# Patient Record
Sex: Female | Born: 1960 | Race: White | Hispanic: No | Marital: Married | Smoking: Never smoker
Health system: Southern US, Community
[De-identification: ages and names within clinical notes are randomized; demographics above are authoritative.]

## PROBLEM LIST (undated history)

## (undated) DIAGNOSIS — I10 Essential (primary) hypertension: Secondary | ICD-10-CM

## (undated) DIAGNOSIS — E119 Type 2 diabetes mellitus without complications: Secondary | ICD-10-CM

## (undated) DIAGNOSIS — R51 Headache: Secondary | ICD-10-CM

## (undated) DIAGNOSIS — F329 Major depressive disorder, single episode, unspecified: Secondary | ICD-10-CM

## (undated) DIAGNOSIS — E785 Hyperlipidemia, unspecified: Secondary | ICD-10-CM

## (undated) DIAGNOSIS — K589 Irritable bowel syndrome without diarrhea: Secondary | ICD-10-CM

## (undated) DIAGNOSIS — F32A Depression, unspecified: Secondary | ICD-10-CM

## (undated) DIAGNOSIS — M542 Cervicalgia: Secondary | ICD-10-CM

## (undated) DIAGNOSIS — G8929 Other chronic pain: Secondary | ICD-10-CM

## (undated) HISTORY — DX: Type 2 diabetes mellitus without complications: E11.9

## (undated) HISTORY — PX: OTHER SURGICAL HISTORY: SHX169

---

## 1998-11-15 ENCOUNTER — Other Ambulatory Visit: Admission: RE | Admit: 1998-11-15 | Discharge: 1998-11-15 | Payer: Self-pay | Admitting: Obstetrics and Gynecology

## 2000-01-08 ENCOUNTER — Other Ambulatory Visit: Admission: RE | Admit: 2000-01-08 | Discharge: 2000-01-08 | Payer: Self-pay | Admitting: Obstetrics and Gynecology

## 2010-02-19 ENCOUNTER — Emergency Department (HOSPITAL_COMMUNITY): Admission: EM | Admit: 2010-02-19 | Discharge: 2010-02-19 | Payer: Self-pay | Admitting: Emergency Medicine

## 2010-07-24 LAB — URINALYSIS, ROUTINE W REFLEX MICROSCOPIC
Bilirubin Urine: NEGATIVE
Glucose, UA: NEGATIVE mg/dL
Hgb urine dipstick: NEGATIVE
Ketones, ur: NEGATIVE mg/dL
Nitrite: NEGATIVE
Protein, ur: NEGATIVE mg/dL
Specific Gravity, Urine: 1.009 (ref 1.005–1.030)
Urobilinogen, UA: 0.2 mg/dL (ref 0.0–1.0)
pH: 7 (ref 5.0–8.0)

## 2010-07-24 LAB — DIFFERENTIAL
Basophils Absolute: 0 10*3/uL (ref 0.0–0.1)
Basophils Relative: 0 % (ref 0–1)
Eosinophils Absolute: 0.1 10*3/uL (ref 0.0–0.7)
Eosinophils Relative: 1 % (ref 0–5)
Lymphocytes Relative: 24 % (ref 12–46)
Lymphs Abs: 1.8 10*3/uL (ref 0.7–4.0)
Monocytes Absolute: 0.5 10*3/uL (ref 0.1–1.0)
Monocytes Relative: 7 % (ref 3–12)
Neutro Abs: 5 10*3/uL (ref 1.7–7.7)
Neutrophils Relative %: 68 % (ref 43–77)

## 2010-07-24 LAB — POCT PREGNANCY, URINE: Preg Test, Ur: NEGATIVE

## 2010-07-24 LAB — URINE MICROSCOPIC-ADD ON

## 2010-07-24 LAB — BASIC METABOLIC PANEL
BUN: 10 mg/dL (ref 6–23)
CO2: 24 mEq/L (ref 19–32)
Calcium: 9.4 mg/dL (ref 8.4–10.5)
Chloride: 103 mEq/L (ref 96–112)
Creatinine, Ser: 0.67 mg/dL (ref 0.4–1.2)
GFR calc Af Amer: >60 mL/min (ref >60)
GFR calc non Af Amer: >60 mL/min (ref >60)
Glucose, Bld: 86 mg/dL (ref 70–99)
Potassium: 3.6 mEq/L (ref 3.5–5.1)
Sodium: 137 mEq/L (ref 135–145)

## 2010-07-24 LAB — CBC
HCT: 42.7 % (ref 36.0–46.0)
Hemoglobin: 14.8 g/dL (ref 12.0–15.0)
MCH: 30.9 pg (ref 26.0–34.0)
MCHC: 34.7 g/dL (ref 30.0–36.0)
MCV: 89.1 fL (ref 78.0–100.0)
Platelets: 307 10*3/uL (ref 150–400)
RBC: 4.8 MIL/uL (ref 3.87–5.11)
RDW: 13.6 % (ref 11.5–15.5)
WBC: 7.5 10*3/uL (ref 4.0–10.5)

## 2011-11-23 IMAGING — CT CT HEAD W/O CM
1 series · 16 of 30 positions shown, 20 images · non-contrast
Comparison: None.

CLINICAL DATA: Dizziness, visual changes, slurred speech

CT HEAD WITHOUT CONTRAST
TECHNIQUE: Contiguous axial images were obtained from the base of
the skull through the vertex without contrast.

[Series 2: headseq 4.8 h45s · axial · 0.43mm/px · z∈[-141,-13]mm · 16 of 30 slices shown, 20 images]
[im 2/30  brain]
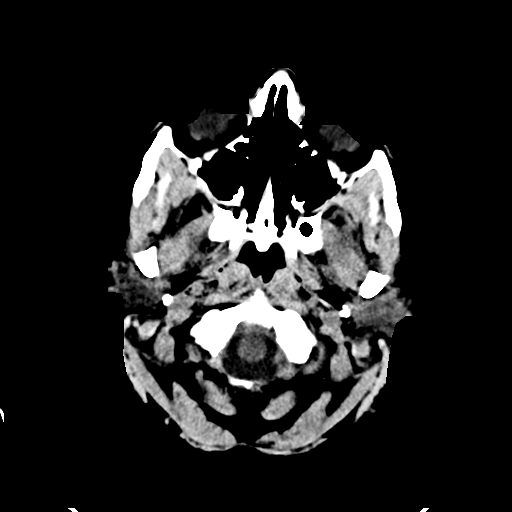
[im 2/30  bone]
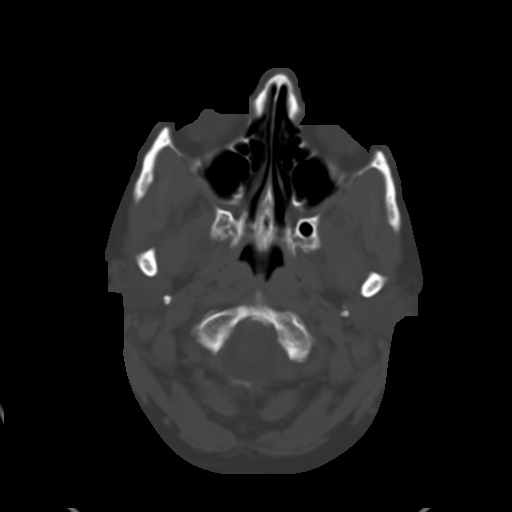
[im 4/30  brain]
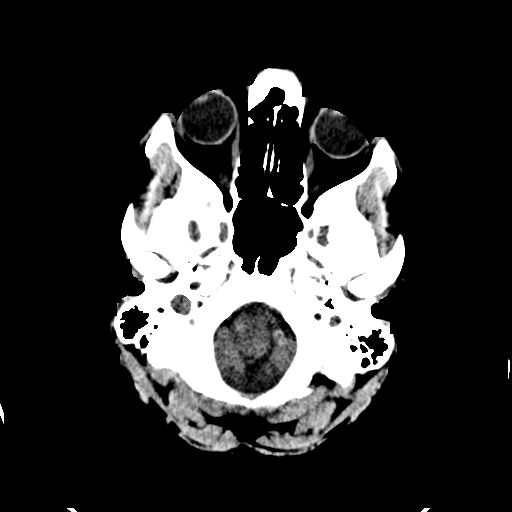
[im 6/30  brain]
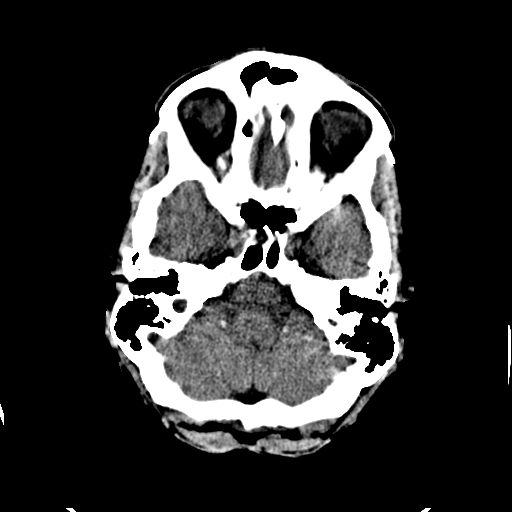
[im 8/30  brain]
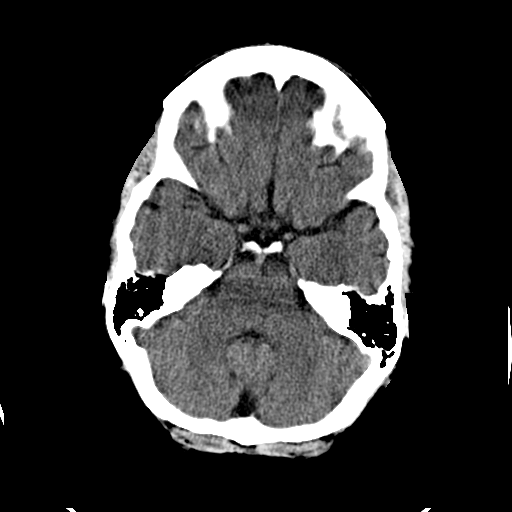
[im 9/30  brain]
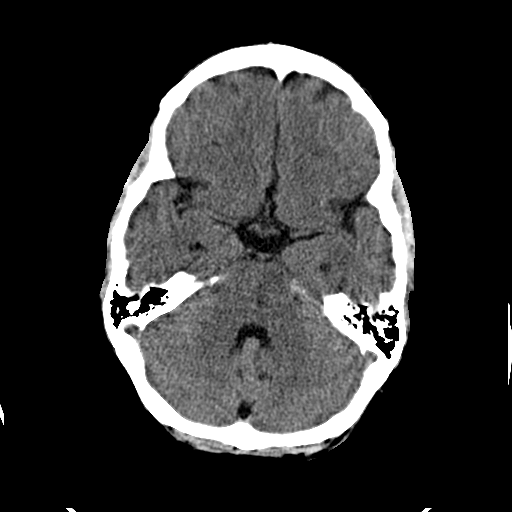
[im 9/30  bone]
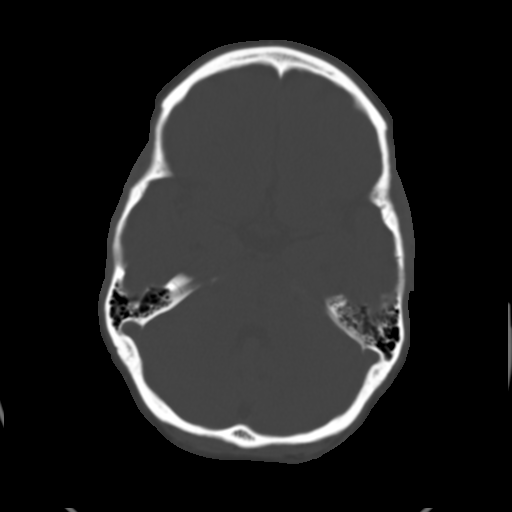
[im 11/30  brain]
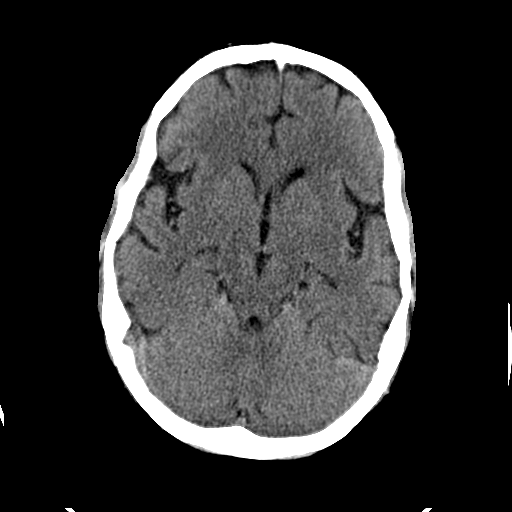
[im 13/30  brain]
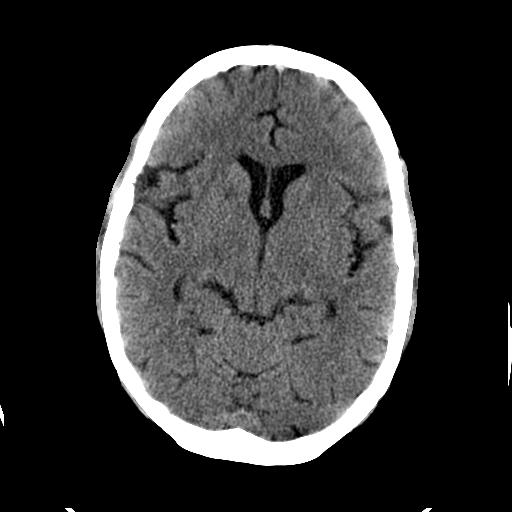
[im 15/30  brain]
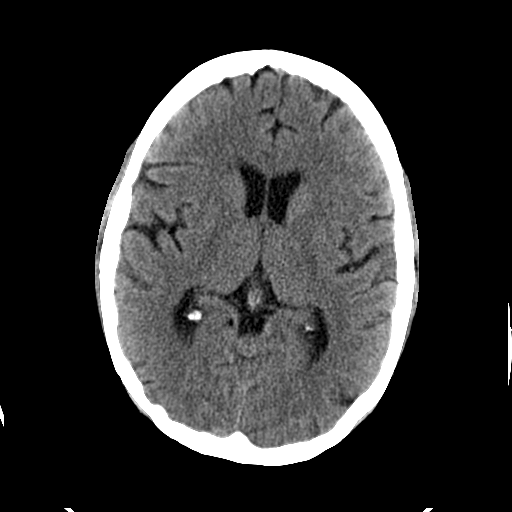
[im 16/30  brain]
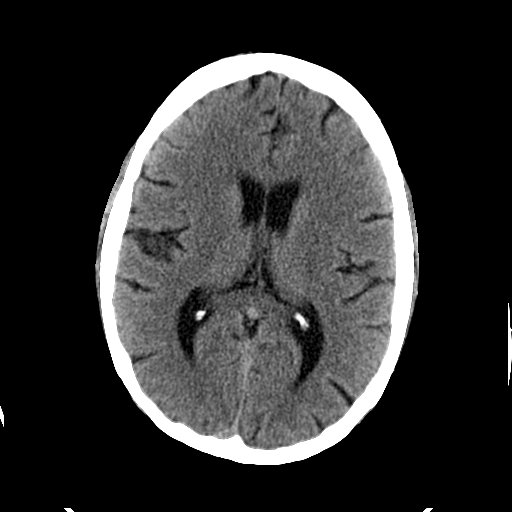
[im 16/30  bone]
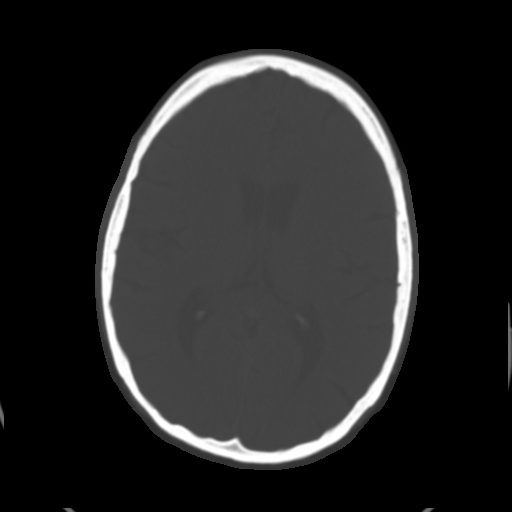
[im 18/30  brain]
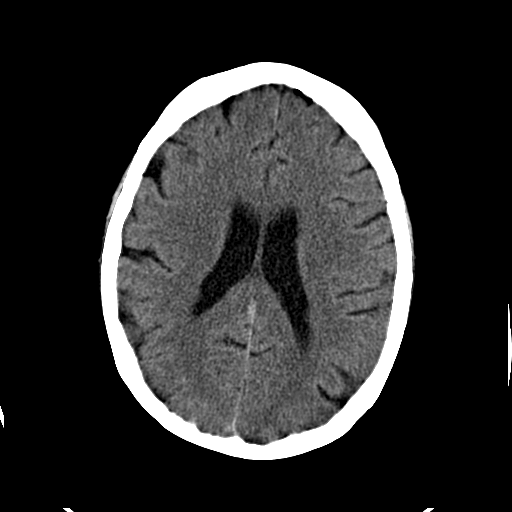
[im 20/30  brain]
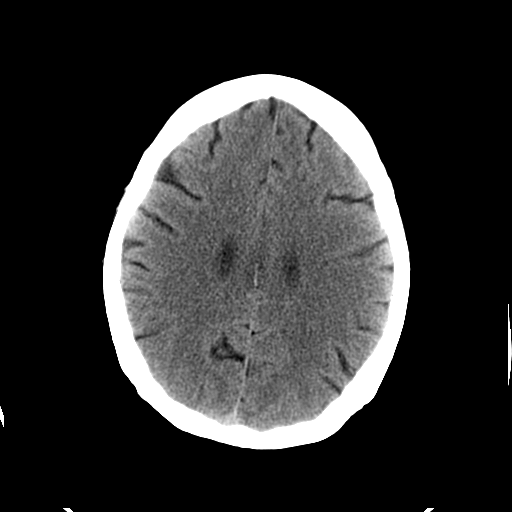
[im 22/30  brain]
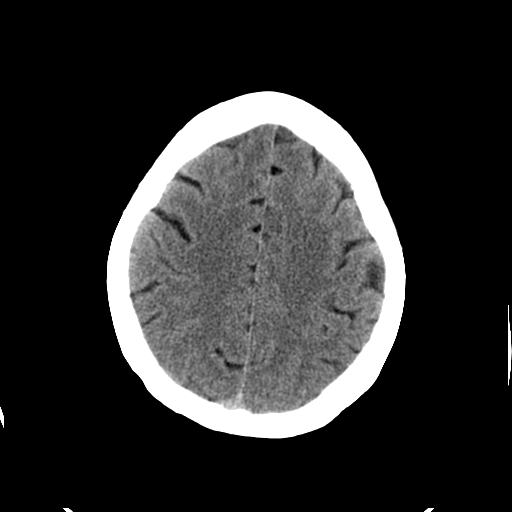
[im 23/30  brain]
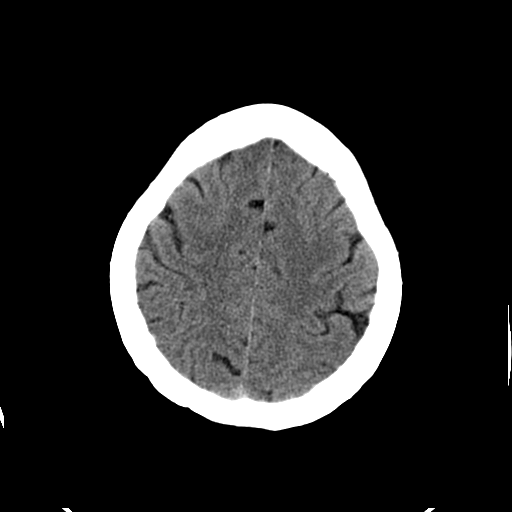
[im 23/30  bone]
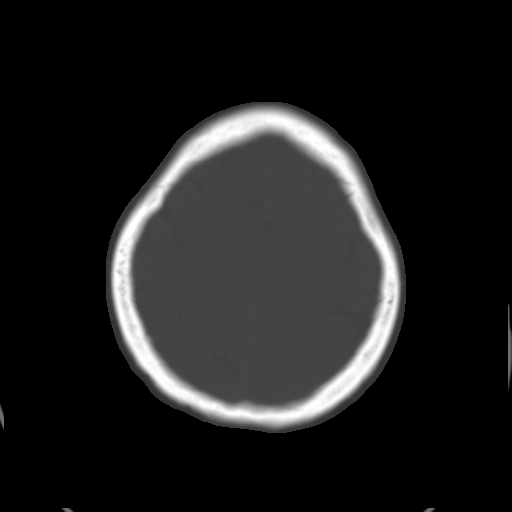
[im 25/30  brain]
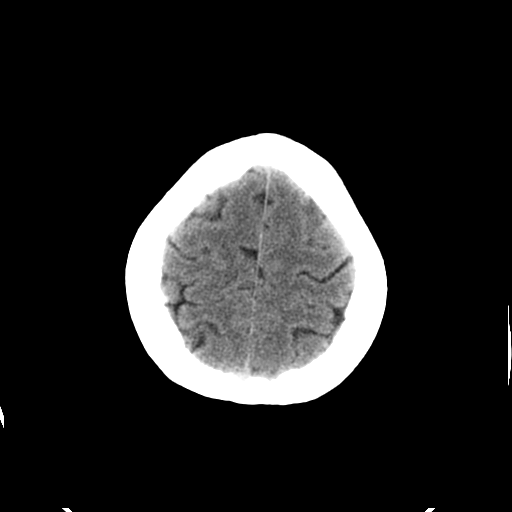
[im 27/30  brain]
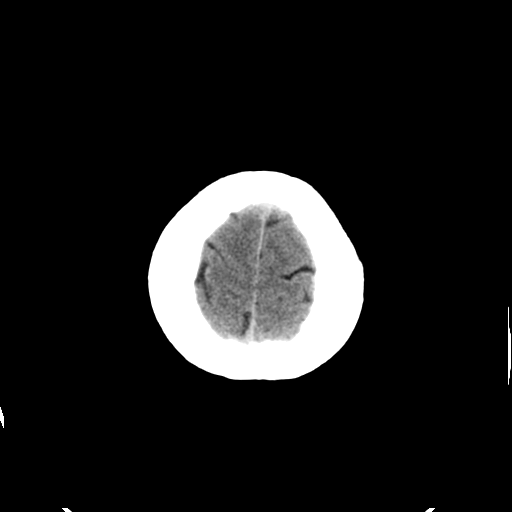
[im 29/30  brain]
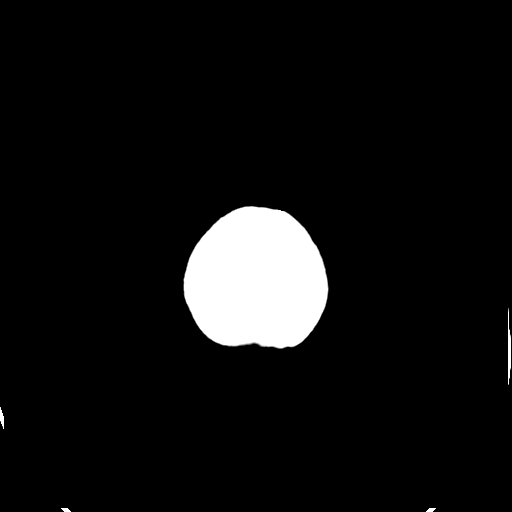

[16 of 30 positions shown; findings below may reference images not displayed]

FINDINGS: There is no evidence of acute intracranial hemorrhage,
brain edema, mass lesion, acute infarction,   mass effect, or
midline shift. Acute infarct may be inapparent on noncontrast CT.
No other intra-axial abnormalities are seen, and the ventricles and
sulci are within normal limits in size and symmetry.   No abnormal
extra-axial fluid collections or masses are identified.  No
significant calvarial abnormality.
IMPRESSION: Negative for bleed or other acute intracranial process.

## 2013-11-08 ENCOUNTER — Other Ambulatory Visit: Payer: Self-pay | Admitting: Gastroenterology

## 2013-11-28 ENCOUNTER — Encounter (HOSPITAL_COMMUNITY): Payer: Self-pay | Admitting: Pharmacy Technician

## 2013-11-30 ENCOUNTER — Encounter (HOSPITAL_COMMUNITY): Payer: Self-pay | Admitting: Pharmacy Technician

## 2013-12-08 ENCOUNTER — Encounter (HOSPITAL_COMMUNITY): Payer: Self-pay | Admitting: *Deleted

## 2013-12-19 ENCOUNTER — Ambulatory Visit (HOSPITAL_COMMUNITY): Payer: 59 | Admitting: Anesthesiology

## 2013-12-19 ENCOUNTER — Encounter (HOSPITAL_COMMUNITY): Payer: 59 | Admitting: Anesthesiology

## 2013-12-19 ENCOUNTER — Ambulatory Visit (HOSPITAL_COMMUNITY)
Admission: RE | Admit: 2013-12-19 | Discharge: 2013-12-19 | Disposition: A | Discharge status: Home / Self Care | Payer: 59 | Source: Ambulatory Visit | Attending: Gastroenterology | Admitting: Gastroenterology

## 2013-12-19 ENCOUNTER — Encounter (HOSPITAL_COMMUNITY): Payer: Self-pay | Admitting: *Deleted

## 2013-12-19 ENCOUNTER — Encounter (HOSPITAL_COMMUNITY)
Admission: RE | Disposition: A | Discharge status: Home / Self Care | Payer: Self-pay | Source: Ambulatory Visit | Attending: Gastroenterology

## 2013-12-19 DIAGNOSIS — I10 Essential (primary) hypertension: Secondary | ICD-10-CM | POA: Diagnosis not present

## 2013-12-19 DIAGNOSIS — R197 Diarrhea, unspecified: Secondary | ICD-10-CM | POA: Insufficient documentation

## 2013-12-19 DIAGNOSIS — Z79899 Other long term (current) drug therapy: Secondary | ICD-10-CM | POA: Insufficient documentation

## 2013-12-19 DIAGNOSIS — M542 Cervicalgia: Secondary | ICD-10-CM | POA: Diagnosis not present

## 2013-12-19 DIAGNOSIS — Z1211 Encounter for screening for malignant neoplasm of colon: Secondary | ICD-10-CM | POA: Insufficient documentation

## 2013-12-19 DIAGNOSIS — G8929 Other chronic pain: Secondary | ICD-10-CM | POA: Insufficient documentation

## 2013-12-19 HISTORY — DX: Headache: R51

## 2013-12-19 HISTORY — PX: COLONOSCOPY WITH PROPOFOL: SHX5780

## 2013-12-19 HISTORY — DX: Essential (primary) hypertension: I10

## 2013-12-19 SURGERY — COLONOSCOPY WITH PROPOFOL
Anesthesia: Monitor Anesthesia Care

## 2013-12-19 MED ORDER — LIDOCAINE HCL (CARDIAC) 20 MG/ML IV SOLN
INTRAVENOUS | Status: DC | PRN
  Administered 2013-12-19: 100 mg via INTRAVENOUS

## 2013-12-19 MED ORDER — ONDANSETRON HCL 4 MG/2ML IJ SOLN
INTRAMUSCULAR | Status: AC
  Filled 2013-12-19: qty 2

## 2013-12-19 MED ORDER — MIDAZOLAM HCL 2 MG/2ML IJ SOLN
INTRAMUSCULAR | Status: AC
  Filled 2013-12-19: qty 2

## 2013-12-19 MED ORDER — PROPOFOL 10 MG/ML IV BOLUS
INTRAVENOUS | Status: AC
  Filled 2013-12-19: qty 20

## 2013-12-19 MED ORDER — SODIUM CHLORIDE 0.9 % IV SOLN: INTRAVENOUS | Status: DC

## 2013-12-19 MED ORDER — PROPOFOL INFUSION 10 MG/ML OPTIME
INTRAVENOUS | Status: DC | PRN
  Administered 2013-12-19: 160 ug/kg/min via INTRAVENOUS

## 2013-12-19 MED ORDER — ONDANSETRON HCL 4 MG/2ML IJ SOLN
INTRAMUSCULAR | Status: DC | PRN
Start: 2013-12-19 — End: 2013-12-19
  Administered 2013-12-19: 4 mg via INTRAVENOUS

## 2013-12-19 MED ORDER — KETAMINE HCL 10 MG/ML IJ SOLN
INTRAMUSCULAR | Status: DC | PRN
  Administered 2013-12-19: 20 mg via INTRAVENOUS

## 2013-12-19 MED ORDER — LIDOCAINE HCL (CARDIAC) 20 MG/ML IV SOLN
INTRAVENOUS | Status: AC
  Filled 2013-12-19: qty 5

## 2013-12-19 MED ORDER — MIDAZOLAM HCL 5 MG/5ML IJ SOLN
INTRAMUSCULAR | Status: DC | PRN
  Administered 2013-12-19: 2 mg via INTRAVENOUS

## 2013-12-19 MED ORDER — LACTATED RINGERS IV SOLN
INTRAVENOUS | Status: DC
  Administered 2013-12-19: 1000 mL via INTRAVENOUS

## 2013-12-19 SURGICAL SUPPLY — 21 items

## 2013-12-19 NOTE — H&P (Signed)
  Procedure: Baseline screening colonoscopy. Chronic watery diarrhea rule out microscopic colitis  History: The patient is a 53 year old female born Nov 13, 1960. She is scheduled to undergo her first screening colonoscopy with polypectomy to prevent colon cancer.  For approximately 2 years, the patient has had intense, postprandial bowel urgency followed by the passage of watery bowel movements. She has not identified any particular food causes her symptoms. She reports no nocturnal diarrhea. There is no family history of colon cancer. She denies anorexia, fever, or weight loss.  Medication allergies: None  Past medical history: Hypertension. Seasonal allergies. Chronic watery diarrhea. Chronic neck pain.  Exam: The patient is alert and lying comfortably on the endoscopy stretcher. Abdomen is soft and nontender to palpation. Lungs are clear to auscultation. Cardiac exam reveals a regular rhythm.  Plan: Proceed with baseline screening colonoscopy and perform random colon biopsies to look for microscopic colitis

## 2013-12-19 NOTE — Transfer of Care (Signed)
Immediate Anesthesia Transfer of Care Note  Patient: Ellen Acosta  Procedure(s) Performed: Procedure(s) (LRB): COLONOSCOPY WITH PROPOFOL (N/A)  Patient Location: PACU  Anesthesia Type: MAC  Level of Consciousness: sedated, patient cooperative and responds to stimulation  Airway & Oxygen Therapy: Patient Spontanous Breathing and Patient connected to face mask oxgen  Post-op Assessment: Report given to PACU RN and Post -op Vital signs reviewed and stable  Post vital signs: Reviewed and stable  Complications: No apparent anesthesia complications

## 2013-12-19 NOTE — Op Note (Signed)
Procedure: Screening colonoscopy. Rule out microscopic colitis.  Endoscopist: Danise EdgeMartin Nuvia Hileman  Premedication: Propofol administered by anesthesia  Procedure: Screening colonoscopy with random colon biopsies to rule out microscopic colitis. The patient was placed in the left lateral decubitus position. Anal inspection and digital rectal exam were normal. The Pentax pediatric colonoscope was introduced into the rectum and advanced to the cecum. A normal-appearing ileocecal valve and appendiceal orifice were identified. Colonic preparation for the exam today was good.  Rectum. Normal. Retroflexed view of the distal rectum normal  Sigmoid colon and descending colon. Normal  Splenic flexure. Normal  Transverse colon. Normal  Hepatic flexure. Normal  Ascending colon. Normal  Cecum and ileocecal valve. Normal  Biopsies: Three biopsies were taken from the right colon and two biopsies were taken from the left colon to look for microscopic colitis  Assessment:  #1. Normal screening colonoscopy  #2. Random colon biopsies to rule out microscopic colitis pending  Recommendation: Schedule repeat screening colonoscopy in 10 years

## 2013-12-19 NOTE — Anesthesia Preprocedure Evaluation (Signed)
Anesthesia Evaluation  Patient identified by MRN, date of birth, ID band Patient awake    Reviewed: Allergy & Precautions, H&P , NPO status , Patient's Chart, lab work & pertinent test results  Airway Mallampati: II TM Distance: >3 FB Neck ROM: Full    Dental no notable dental hx.    Pulmonary neg pulmonary ROS,  breath sounds clear to auscultation  Pulmonary exam normal       Cardiovascular hypertension, Pt. on medications Rhythm:Regular Rate:Normal     Neuro/Psych negative neurological ROS  negative psych ROS   GI/Hepatic negative GI ROS, Neg liver ROS,   Endo/Other  negative endocrine ROS  Renal/GU negative Renal ROS  negative genitourinary   Musculoskeletal negative musculoskeletal ROS (+)   Abdominal   Peds negative pediatric ROS (+)  Hematology negative hematology ROS (+)   Anesthesia Other Findings   Reproductive/Obstetrics negative OB ROS                         Anesthesia Physical Anesthesia Plan  ASA: II  Anesthesia Plan: MAC   Post-op Pain Management:    Induction:   Airway Management Planned: Simple Face Mask  Additional Equipment:   Intra-op Plan:   Post-operative Plan:   Informed Consent: I have reviewed the patients History and Physical, chart, labs and discussed the procedure including the risks, benefits and alternatives for the proposed anesthesia with the patient or authorized representative who has indicated his/her understanding and acceptance.   Dental advisory given  Plan Discussed with: CRNA  Anesthesia Plan Comments:         Anesthesia Quick Evaluation  

## 2013-12-19 NOTE — Anesthesia Postprocedure Evaluation (Signed)
  Anesthesia Post-op Note  Patient: Ellen Acosta  Procedure(s) Performed: Procedure(s) (LRB): COLONOSCOPY WITH PROPOFOL (N/A)  Patient Location: PACU  Anesthesia Type: MAC  Level of Consciousness: awake and alert   Airway and Oxygen Therapy: Patient Spontanous Breathing  Post-op Pain: mild  Post-op Assessment: Post-op Vital signs reviewed, Patient's Cardiovascular Status Stable, Respiratory Function Stable, Patent Airway and No signs of Nausea or vomiting  Last Vitals:  Filed Vitals:   12/19/13 1038  BP: 149/94  Pulse: 86  Temp:   Resp: 11    Post-op Vital Signs: stable   Complications: No apparent anesthesia complications

## 2013-12-19 NOTE — Discharge Instructions (Addendum)

## 2013-12-20 ENCOUNTER — Encounter (HOSPITAL_COMMUNITY): Payer: Self-pay | Admitting: Gastroenterology

## 2015-02-05 ENCOUNTER — Ambulatory Visit: Payer: Self-pay | Admitting: Physician Assistant

## 2015-02-06 ENCOUNTER — Encounter (HOSPITAL_COMMUNITY): Payer: Self-pay

## 2015-02-06 ENCOUNTER — Encounter (HOSPITAL_COMMUNITY)
Admission: RE | Admit: 2015-02-06 | Discharge: 2015-02-06 | Disposition: A | Discharge status: Home / Self Care | Payer: 59 | Source: Ambulatory Visit | Attending: Orthopedic Surgery | Admitting: Orthopedic Surgery

## 2015-02-06 DIAGNOSIS — I10 Essential (primary) hypertension: Secondary | ICD-10-CM | POA: Diagnosis not present

## 2015-02-06 DIAGNOSIS — Z79899 Other long term (current) drug therapy: Secondary | ICD-10-CM | POA: Diagnosis not present

## 2015-02-06 DIAGNOSIS — M4722 Other spondylosis with radiculopathy, cervical region: Secondary | ICD-10-CM | POA: Diagnosis not present

## 2015-02-06 HISTORY — DX: Depression, unspecified: F32.A

## 2015-02-06 HISTORY — DX: Major depressive disorder, single episode, unspecified: F32.9

## 2015-02-06 HISTORY — DX: Irritable bowel syndrome, unspecified: K58.9

## 2015-02-06 HISTORY — DX: Other chronic pain: G89.29

## 2015-02-06 HISTORY — DX: Hyperlipidemia, unspecified: E78.5

## 2015-02-06 HISTORY — DX: Cervicalgia: M54.2

## 2015-02-06 LAB — BASIC METABOLIC PANEL
Anion gap: 12 (ref 5–15)
BUN: 9 mg/dL (ref 6–20)
CALCIUM: 9.5 mg/dL (ref 8.9–10.3)
CO2: 23 mmol/L (ref 22–32)
Chloride: 102 mmol/L (ref 101–111)
Creatinine, Ser: 0.54 mg/dL (ref 0.44–1.00)
GFR calc Af Amer: >60 mL/min (ref >60)
Glucose, Bld: 72 mg/dL (ref 65–99)
Potassium: 3.2 mmol/L — ABNORMAL LOW (ref 3.5–5.1)
SODIUM: 137 mmol/L (ref 135–145)

## 2015-02-06 LAB — SURGICAL PCR SCREEN
MRSA, PCR: NEGATIVE
STAPHYLOCOCCUS AUREUS: POSITIVE — AB

## 2015-02-06 LAB — HCG, SERUM, QUALITATIVE: PREG SERUM: NEGATIVE

## 2015-02-06 MED ORDER — CEFAZOLIN SODIUM-DEXTROSE 2-3 GM-% IV SOLR
2.0000 g | INTRAVENOUS | Status: AC
  Administered 2015-02-07: 2 g via INTRAVENOUS
  Filled 2015-02-06: qty 50

## 2015-02-06 NOTE — Pre-Procedure Instructions (Signed)
Ellen Acosta  02/06/2015      [id number] - [place], [place] - [address] [name] [address] Phone: [phone] Fax: [phone]    Your procedure is scheduled on Thursday, February 07, 2015.  Report to [facility] Admitting at 12:30 P.M.  Call this number if you have problems the morning of surgery:  [phone]   Remember:  Do not eat food or drink liquids after midnight.  Take these medicines the morning of surgery with A SIP OF WATER :metoprolol succinate (TOPROL-XL), norgestrel-ethinyl estradiol (LO/OVRAL,CRYSELLE), Duloxetine ( Cymbalta), if needed: oxycodone for pain.  Stop taking Aspirin, Vitamins and herbal medications. Do not take any NSAIDs ie: Ibuprofen, Advil, Naproxen or any medication containing Aspirin; stop now.   Do not wear jewelry, make-up or nail polish.  Do not wear lotions, powders, or perfumes.  You may not wear deodorant.  Do not shave 48 hours prior to surgery.    Do not bring valuables to the hospital.  [facility] is not responsible for any belongings or valuables.  Contacts, dentures or bridgework may not be worn into surgery.  Leave your suitcase in the car.  After surgery it may be brought to your room.  For patients admitted to the hospital, discharge time will be determined by your treatment team.  Patients discharged the day of surgery will not be allowed to drive home.   Name and phone number of your driver:    Special instructions: [name] - Preparing for Surgery  Before surgery, you can play an important role.  Because skin is not sterile, your skin needs to be as free of germs as possible.  You can reduce the number of germs on you skin by washing with CHG (chlorahexidine gluconate) soap before surgery.  CHG is an antiseptic cleaner which kills germs and bonds with the skin to continue killing germs even after washing.  Please DO NOT use if you  have an allergy to CHG or antibacterial soaps.  If your skin becomes reddened/irritated stop using the CHG and inform your nurse when you arrive at Short Stay.  Do not shave (including legs and underarms) for at least 48 hours prior to the first CHG shower.  You may shave your face.  Please follow these instructions carefully:   1.  Shower with CHG Soap the night before surgery and the morning of Surgery.  2.  If you choose to wash your hair, wash your hair first as usual with your normal shampoo.  3.  After you shampoo, rinse your hair and body thoroughly to remove the Shampoo.  4.  Use CHG as you would any other liquid soap.  You can apply chg directly  to the skin and wash gently with scrungie or a clean washcloth.  5.  Apply the CHG Soap to your body ONLY FROM THE NECK DOWN.  Do not use on open wounds or open sores.  Avoid contact with your eyes, ears, mouth and genitals (private parts).  Wash genitals (private parts) with your normal soap.  6.  Wash thoroughly, paying special attention to the area where your surgery will be performed.  7.  Thoroughly rinse your body with warm water from the neck down.  8.  DO NOT shower/wash with your normal soap after using and rinsing off the CHG Soap.  9.  Pat yourself dry with a clean towel.  10.  Wear clean pajamas.            11.  Place clean sheets on your bed the night of your first shower and do not sleep with pets.  Day of Surgery  Do not apply any lotions/deodorants the morning of surgery.  Please wear clean clothes to the hospital/surgery center.  Please read over the following fact sheets that you were given. Pain Booklet, Coughing and Deep Breathing, MRSA Information and Surgical Site Infection Prevention

## 2015-02-07 ENCOUNTER — Observation Stay (HOSPITAL_COMMUNITY): Payer: 59

## 2015-02-07 ENCOUNTER — Ambulatory Visit (HOSPITAL_COMMUNITY): Payer: 59

## 2015-02-07 ENCOUNTER — Encounter (HOSPITAL_COMMUNITY)
Admission: RE | Disposition: A | Discharge status: Home / Self Care | Payer: Self-pay | Source: Ambulatory Visit | Attending: Orthopedic Surgery

## 2015-02-07 ENCOUNTER — Ambulatory Visit (HOSPITAL_COMMUNITY): Payer: 59 | Admitting: Anesthesiology

## 2015-02-07 ENCOUNTER — Observation Stay (HOSPITAL_COMMUNITY)
Admission: RE | Admit: 2015-02-07 | Discharge: 2015-02-08 | Disposition: A | Discharge status: Home / Self Care | Payer: 59 | Source: Ambulatory Visit | Attending: Orthopedic Surgery | Admitting: Orthopedic Surgery

## 2015-02-07 ENCOUNTER — Encounter (HOSPITAL_COMMUNITY): Payer: Self-pay | Admitting: General Practice

## 2015-02-07 DIAGNOSIS — Z79899 Other long term (current) drug therapy: Secondary | ICD-10-CM | POA: Insufficient documentation

## 2015-02-07 DIAGNOSIS — M4722 Other spondylosis with radiculopathy, cervical region: Secondary | ICD-10-CM | POA: Diagnosis not present

## 2015-02-07 DIAGNOSIS — I10 Essential (primary) hypertension: Secondary | ICD-10-CM | POA: Insufficient documentation

## 2015-02-07 DIAGNOSIS — M542 Cervicalgia: Secondary | ICD-10-CM | POA: Diagnosis present

## 2015-02-07 DIAGNOSIS — Z419 Encounter for procedure for purposes other than remedying health state, unspecified: Secondary | ICD-10-CM

## 2015-02-07 DIAGNOSIS — M4322 Fusion of spine, cervical region: Secondary | ICD-10-CM

## 2015-02-07 HISTORY — PX: ANTERIOR CERVICAL DECOMP/DISCECTOMY FUSION: SHX1161

## 2015-02-07 SURGERY — ANTERIOR CERVICAL DECOMPRESSION/DISCECTOMY FUSION 2 LEVELS
Anesthesia: General

## 2015-02-07 MED ORDER — METHOCARBAMOL 500 MG PO TABS
ORAL_TABLET | ORAL | Status: AC
  Filled 2015-02-07: qty 1

## 2015-02-07 MED ORDER — THROMBIN 20000 UNITS EX SOLR
CUTANEOUS | Status: AC
  Filled 2015-02-07: qty 20000

## 2015-02-07 MED ORDER — BUPIVACAINE-EPINEPHRINE 0.25% -1:200000 IJ SOLN
INTRAMUSCULAR | Status: DC | PRN
  Administered 2015-02-07: 7.5 mL

## 2015-02-07 MED ORDER — ONDANSETRON HCL 4 MG PO TABS: 4.0000 mg | ORAL_TABLET | Freq: Three times a day (TID) | ORAL | Status: DC | PRN

## 2015-02-07 MED ORDER — 0.9 % SODIUM CHLORIDE (POUR BTL) OPTIME
TOPICAL | Status: DC | PRN
  Administered 2015-02-07: 1000 mL

## 2015-02-07 MED ORDER — ROCURONIUM BROMIDE 50 MG/5ML IV SOLN
INTRAVENOUS | Status: AC
  Filled 2015-02-07: qty 3

## 2015-02-07 MED ORDER — FENTANYL CITRATE (PF) 250 MCG/5ML IJ SOLN
INTRAMUSCULAR | Status: AC
  Filled 2015-02-07: qty 5

## 2015-02-07 MED ORDER — MIDAZOLAM HCL 2 MG/2ML IJ SOLN
INTRAMUSCULAR | Status: AC
  Filled 2015-02-07: qty 4

## 2015-02-07 MED ORDER — MORPHINE SULFATE (PF) 2 MG/ML IV SOLN: 1.0000 mg | INTRAVENOUS | Status: DC | PRN

## 2015-02-07 MED ORDER — ONDANSETRON HCL 4 MG/2ML IJ SOLN: 4.0000 mg | Freq: Four times a day (QID) | INTRAMUSCULAR | Status: DC | PRN

## 2015-02-07 MED ORDER — ARTIFICIAL TEARS OP OINT
TOPICAL_OINTMENT | OPHTHALMIC | Status: AC
  Filled 2015-02-07: qty 3.5

## 2015-02-07 MED ORDER — ACETAMINOPHEN 10 MG/ML IV SOLN
INTRAVENOUS | Status: AC
  Filled 2015-02-07: qty 100

## 2015-02-07 MED ORDER — OXYCODONE HCL 5 MG PO TABS
ORAL_TABLET | ORAL | Status: AC
  Filled 2015-02-07: qty 1

## 2015-02-07 MED ORDER — OXYCODONE-ACETAMINOPHEN 10-325 MG PO TABS: 1.0000 | ORAL_TABLET | ORAL | Status: DC | PRN

## 2015-02-07 MED ORDER — DEXAMETHASONE SODIUM PHOSPHATE 10 MG/ML IJ SOLN
INTRAMUSCULAR | Status: DC | PRN
  Administered 2015-02-07: 10 mg via INTRAVENOUS

## 2015-02-07 MED ORDER — PROPOFOL 10 MG/ML IV BOLUS
INTRAVENOUS | Status: DC | PRN
  Administered 2015-02-07: 120 mg via INTRAVENOUS

## 2015-02-07 MED ORDER — NORGESTREL-ETHINYL ESTRADIOL 0.3-30 MG-MCG PO TABS
1.0000 | ORAL_TABLET | Freq: Every day | ORAL | Status: DC
  Administered 2015-02-08: 1 via ORAL

## 2015-02-07 MED ORDER — DULOXETINE HCL 30 MG PO CPEP
30.0000 mg | ORAL_CAPSULE | Freq: Every day | ORAL | Status: DC
  Administered 2015-02-07 – 2015-02-08 (×2): 30 mg via ORAL
  Filled 2015-02-07 (×2): qty 1

## 2015-02-07 MED ORDER — GLYCOPYRROLATE 0.2 MG/ML IJ SOLN
INTRAMUSCULAR | Status: AC
  Filled 2015-02-07: qty 3

## 2015-02-07 MED ORDER — ONDANSETRON HCL 4 MG/2ML IJ SOLN
INTRAMUSCULAR | Status: AC
  Filled 2015-02-07: qty 10

## 2015-02-07 MED ORDER — PHENYLEPHRINE HCL 10 MG/ML IJ SOLN
10.0000 mg | INTRAMUSCULAR | Status: DC | PRN
  Administered 2015-02-07: 20 ug/min via INTRAVENOUS

## 2015-02-07 MED ORDER — SODIUM CHLORIDE 0.9 % IJ SOLN
INTRAMUSCULAR | Status: AC
  Filled 2015-02-07: qty 10

## 2015-02-07 MED ORDER — PHENYLEPHRINE 40 MCG/ML (10ML) SYRINGE FOR IV PUSH (FOR BLOOD PRESSURE SUPPORT)
PREFILLED_SYRINGE | INTRAVENOUS | Status: AC
  Filled 2015-02-07: qty 50

## 2015-02-07 MED ORDER — ONDANSETRON HCL 4 MG/2ML IJ SOLN: 4.0000 mg | INTRAMUSCULAR | Status: DC | PRN

## 2015-02-07 MED ORDER — ALPRAZOLAM 0.5 MG PO TABS
0.5000 mg | ORAL_TABLET | Freq: Two times a day (BID) | ORAL | Status: DC | PRN
  Administered 2015-02-07: 0.5 mg via ORAL
  Filled 2015-02-07: qty 1

## 2015-02-07 MED ORDER — MENTHOL 3 MG MT LOZG: 1.0000 | LOZENGE | OROMUCOSAL | Status: DC | PRN

## 2015-02-07 MED ORDER — PHENOL 1.4 % MT LIQD: 1.0000 | OROMUCOSAL | Status: DC | PRN

## 2015-02-07 MED ORDER — METHOCARBAMOL 500 MG PO TABS
500.0000 mg | ORAL_TABLET | Freq: Four times a day (QID) | ORAL | Status: DC | PRN
  Administered 2015-02-07 – 2015-02-08 (×2): 500 mg via ORAL
  Filled 2015-02-07: qty 1

## 2015-02-07 MED ORDER — NEOSTIGMINE METHYLSULFATE 10 MG/10ML IV SOLN
INTRAVENOUS | Status: DC | PRN
  Administered 2015-02-07: 3 mg via INTRAVENOUS

## 2015-02-07 MED ORDER — OXYCODONE HCL 5 MG/5ML PO SOLN: 5.0000 mg | Freq: Once | ORAL | Status: AC | PRN

## 2015-02-07 MED ORDER — HYDROCHLOROTHIAZIDE 25 MG PO TABS
25.0000 mg | ORAL_TABLET | Freq: Every morning | ORAL | Status: DC
  Administered 2015-02-08: 25 mg via ORAL
  Filled 2015-02-07: qty 1

## 2015-02-07 MED ORDER — HYDROMORPHONE HCL 1 MG/ML IJ SOLN
0.2500 mg | INTRAMUSCULAR | Status: DC | PRN
  Administered 2015-02-07 (×4): 0.5 mg via INTRAVENOUS

## 2015-02-07 MED ORDER — METHOCARBAMOL 500 MG PO TABS: 500.0000 mg | ORAL_TABLET | Freq: Three times a day (TID) | ORAL | Status: DC | PRN

## 2015-02-07 MED ORDER — METHOCARBAMOL 1000 MG/10ML IJ SOLN: 500.0000 mg | Freq: Four times a day (QID) | INTRAVENOUS | Status: DC | PRN

## 2015-02-07 MED ORDER — METOPROLOL SUCCINATE ER 25 MG PO TB24
50.0000 mg | ORAL_TABLET | Freq: Every morning | ORAL | Status: DC
  Administered 2015-02-08: 50 mg via ORAL
  Filled 2015-02-07: qty 2

## 2015-02-07 MED ORDER — DEXAMETHASONE 4 MG PO TABS
4.0000 mg | ORAL_TABLET | Freq: Four times a day (QID) | ORAL | Status: DC
  Administered 2015-02-07 – 2015-02-08 (×2): 4 mg via ORAL
  Filled 2015-02-07 (×2): qty 1

## 2015-02-07 MED ORDER — ACETAMINOPHEN 10 MG/ML IV SOLN
INTRAVENOUS | Status: DC | PRN
  Administered 2015-02-07: 1000 mg via INTRAVENOUS

## 2015-02-07 MED ORDER — SUGAMMADEX SODIUM 200 MG/2ML IV SOLN
INTRAVENOUS | Status: AC
  Filled 2015-02-07: qty 2

## 2015-02-07 MED ORDER — SODIUM CHLORIDE 0.9 % IV SOLN: 250.0000 mL | INTRAVENOUS | Status: DC

## 2015-02-07 MED ORDER — GLYCOPYRROLATE 0.2 MG/ML IJ SOLN
INTRAMUSCULAR | Status: DC | PRN
  Administered 2015-02-07: .4 mg via INTRAVENOUS

## 2015-02-07 MED ORDER — DEXAMETHASONE SODIUM PHOSPHATE 4 MG/ML IJ SOLN: 4.0000 mg | Freq: Four times a day (QID) | INTRAMUSCULAR | Status: DC

## 2015-02-07 MED ORDER — SODIUM CHLORIDE 0.9 % IJ SOLN
3.0000 mL | Freq: Two times a day (BID) | INTRAMUSCULAR | Status: DC
  Administered 2015-02-07: 3 mL via INTRAVENOUS

## 2015-02-07 MED ORDER — PROPOFOL 10 MG/ML IV BOLUS
INTRAVENOUS | Status: AC
  Filled 2015-02-07: qty 20

## 2015-02-07 MED ORDER — HYDROMORPHONE HCL 1 MG/ML IJ SOLN
INTRAMUSCULAR | Status: AC
  Administered 2015-02-07: 0.5 mg via INTRAVENOUS
  Filled 2015-02-07: qty 1

## 2015-02-07 MED ORDER — DEXAMETHASONE SODIUM PHOSPHATE 4 MG/ML IJ SOLN
INTRAMUSCULAR | Status: AC
  Filled 2015-02-07: qty 1

## 2015-02-07 MED ORDER — ARTIFICIAL TEARS OP OINT
TOPICAL_OINTMENT | OPHTHALMIC | Status: DC | PRN
  Administered 2015-02-07: 1 via OPHTHALMIC

## 2015-02-07 MED ORDER — CLOMIPRAMINE HCL 25 MG PO CAPS
50.0000 mg | ORAL_CAPSULE | Freq: Every day | ORAL | Status: DC
  Filled 2015-02-07 (×2): qty 2

## 2015-02-07 MED ORDER — LACTATED RINGERS IV SOLN
INTRAVENOUS | Status: DC
  Administered 2015-02-07 (×2): via INTRAVENOUS

## 2015-02-07 MED ORDER — EPHEDRINE SULFATE 50 MG/ML IJ SOLN
INTRAMUSCULAR | Status: AC
  Filled 2015-02-07: qty 2

## 2015-02-07 MED ORDER — ROCURONIUM BROMIDE 100 MG/10ML IV SOLN
INTRAVENOUS | Status: DC | PRN
  Administered 2015-02-07: 50 mg via INTRAVENOUS

## 2015-02-07 MED ORDER — PHENYLEPHRINE HCL 10 MG/ML IJ SOLN
INTRAMUSCULAR | Status: DC | PRN
  Administered 2015-02-07 (×2): 40 ug via INTRAVENOUS
  Administered 2015-02-07: 120 ug via INTRAVENOUS

## 2015-02-07 MED ORDER — ONDANSETRON HCL 4 MG/2ML IJ SOLN
INTRAMUSCULAR | Status: DC | PRN
  Administered 2015-02-07: 4 mg via INTRAVENOUS

## 2015-02-07 MED ORDER — OXYCODONE HCL 5 MG PO TABS
5.0000 mg | ORAL_TABLET | Freq: Once | ORAL | Status: AC | PRN
  Administered 2015-02-07: 5 mg via ORAL

## 2015-02-07 MED ORDER — LACTATED RINGERS IV SOLN: INTRAVENOUS | Status: DC

## 2015-02-07 MED ORDER — FENTANYL CITRATE (PF) 100 MCG/2ML IJ SOLN
INTRAMUSCULAR | Status: DC | PRN
  Administered 2015-02-07 (×2): 50 ug via INTRAVENOUS
  Administered 2015-02-07: 100 ug via INTRAVENOUS
  Administered 2015-02-07: 50 ug via INTRAVENOUS
  Administered 2015-02-07 (×2): 25 ug via INTRAVENOUS
  Administered 2015-02-07: 50 ug via INTRAVENOUS
  Administered 2015-02-07: 25 ug via INTRAVENOUS
  Administered 2015-02-07 (×3): 50 ug via INTRAVENOUS

## 2015-02-07 MED ORDER — ATROPINE SULFATE 0.1 MG/ML IJ SOLN
INTRAMUSCULAR | Status: AC
  Filled 2015-02-07: qty 10

## 2015-02-07 MED ORDER — THROMBIN 20000 UNITS EX SOLR
CUTANEOUS | Status: DC | PRN
  Administered 2015-02-07: 17:00:00 via TOPICAL

## 2015-02-07 MED ORDER — LIDOCAINE HCL (CARDIAC) 20 MG/ML IV SOLN
INTRAVENOUS | Status: AC
  Filled 2015-02-07: qty 15

## 2015-02-07 MED ORDER — SODIUM CHLORIDE 0.9 % IJ SOLN: 3.0000 mL | INTRAMUSCULAR | Status: DC | PRN

## 2015-02-07 MED ORDER — LIDOCAINE HCL (CARDIAC) 20 MG/ML IV SOLN
INTRAVENOUS | Status: DC | PRN
  Administered 2015-02-07: 100 mg via INTRAVENOUS

## 2015-02-07 MED ORDER — OXYCODONE HCL 5 MG PO TABS
10.0000 mg | ORAL_TABLET | ORAL | Status: DC | PRN
  Administered 2015-02-07 – 2015-02-08 (×4): 10 mg via ORAL
  Filled 2015-02-07 (×4): qty 2

## 2015-02-07 MED ORDER — CEFAZOLIN SODIUM 1-5 GM-% IV SOLN
1.0000 g | Freq: Three times a day (TID) | INTRAVENOUS | Status: AC
  Administered 2015-02-07 – 2015-02-08 (×2): 1 g via INTRAVENOUS
  Filled 2015-02-07 (×2): qty 50

## 2015-02-07 MED ORDER — BUPIVACAINE-EPINEPHRINE (PF) 0.25% -1:200000 IJ SOLN
INTRAMUSCULAR | Status: AC
  Filled 2015-02-07: qty 30

## 2015-02-07 MED ORDER — MIDAZOLAM HCL 5 MG/5ML IJ SOLN
INTRAMUSCULAR | Status: DC | PRN
  Administered 2015-02-07: 2 mg via INTRAVENOUS

## 2015-02-07 SURGICAL SUPPLY — 65 items
BIT DRILL SKYLINE 12MM (BIT) ×1 IMPLANT
CANISTER SUCTION 2500CC (MISCELLANEOUS) ×2 IMPLANT
CLSR STERI-STRIP ANTIMIC 1/2X4 (GAUZE/BANDAGES/DRESSINGS) ×2 IMPLANT
CORDS BIPOLAR (ELECTRODE) ×2 IMPLANT
COVER SURGICAL LIGHT HANDLE (MISCELLANEOUS) ×4 IMPLANT
CRADLE DONUT ADULT HEAD (MISCELLANEOUS) ×2 IMPLANT
DRAPE C-ARM 42X72 X-RAY (DRAPES) ×2 IMPLANT
DRAPE POUCH INSTRU U-SHP 10X18 (DRAPES) ×2 IMPLANT
DRAPE SURG 17X23 STRL (DRAPES) ×2 IMPLANT
DRAPE U-SHAPE 47X51 STRL (DRAPES) ×2 IMPLANT
DRILL BIT SKYLINE 12MM (BIT) ×1
DRSG MEPILEX BORDER 4X4 (GAUZE/BANDAGES/DRESSINGS) ×2 IMPLANT
DURAPREP 26ML APPLICATOR (WOUND CARE) ×2 IMPLANT
ELECT COATED BLADE 2.86 ST (ELECTRODE) ×2 IMPLANT
ELECT PENCIL ROCKER SW 15FT (MISCELLANEOUS) ×2 IMPLANT
ELECT REM PT RETURN 9FT ADLT (ELECTROSURGICAL) ×2
ELECTRODE REM PT RTRN 9FT ADLT (ELECTROSURGICAL) ×1 IMPLANT
GLOVE BIO SURGEON STRL SZ 6.5 (GLOVE) ×2 IMPLANT
GLOVE BIOGEL PI IND STRL 7.0 (GLOVE) ×2 IMPLANT
GLOVE BIOGEL PI IND STRL 8 (GLOVE) ×1 IMPLANT
GLOVE BIOGEL PI IND STRL 8.5 (GLOVE) ×1 IMPLANT
GLOVE BIOGEL PI INDICATOR 7.0 (GLOVE) ×2
GLOVE BIOGEL PI INDICATOR 8 (GLOVE) ×1
GLOVE BIOGEL PI INDICATOR 8.5 (GLOVE) ×1
GLOVE ORTHO TXT STRL SZ7.5 (GLOVE) ×2 IMPLANT
GLOVE SS BIOGEL STRL SZ 8.5 (GLOVE) ×1 IMPLANT
GLOVE SUPERSENSE BIOGEL SZ 8.5 (GLOVE) ×1
GOWN STRL REUS W/ TWL XL LVL3 (GOWN DISPOSABLE) ×2 IMPLANT
GOWN STRL REUS W/TWL 2XL LVL3 (GOWN DISPOSABLE) ×4 IMPLANT
GOWN STRL REUS W/TWL XL LVL3 (GOWN DISPOSABLE) ×2
ILLUMINATOR WAVEGUIDE N/F (MISCELLANEOUS) ×4 IMPLANT
INTERLOCK LRDTC CRVCL VBR 7MM (Bone Implant) ×2 IMPLANT
KIT BASIN OR (CUSTOM PROCEDURE TRAY) ×2 IMPLANT
KIT ROOM TURNOVER OR (KITS) ×2 IMPLANT
LORDOTIC CERVICAL VBR 7MM SM (Bone Implant) ×4 IMPLANT
NEEDLE SPNL 18GX3.5 QUINCKE PK (NEEDLE) ×2 IMPLANT
NS IRRIG 1000ML POUR BTL (IV SOLUTION) ×2 IMPLANT
PACK ORTHO CERVICAL (CUSTOM PROCEDURE TRAY) ×2 IMPLANT
PACK UNIVERSAL I (CUSTOM PROCEDURE TRAY) ×2 IMPLANT
PAD ARMBOARD 7.5X6 YLW CONV (MISCELLANEOUS) ×4 IMPLANT
PATTIES SURGICAL .25X.25 (GAUZE/BANDAGES/DRESSINGS) ×2 IMPLANT
PIN DISTRACTION 14 (PIN) ×2 IMPLANT
PIN RETAINER PRODISC 14 MM (PIN) ×2 IMPLANT
PLATE TWO LEVEL SKYLINE 30MM (Plate) ×2 IMPLANT
PUTTY BONE DBX 2.5 MIS (Bone Implant) ×2 IMPLANT
RESTRAINT LIMB HOLDER UNIV (RESTRAINTS) ×2 IMPLANT
SCREW SELF DRILL SKYLINE 12MM (Screw) ×4 IMPLANT
SCREW SKYLINE 14MM SD-VA (Screw) ×8 IMPLANT
SPONGE INTESTINAL PEANUT (DISPOSABLE) ×2 IMPLANT
SPONGE LAP 4X18 X RAY DECT (DISPOSABLE) ×4 IMPLANT
SPONGE SURGIFOAM ABS GEL 100 (HEMOSTASIS) ×2 IMPLANT
SURGIFLO W/THROMBIN 8M KIT (HEMOSTASIS) ×2 IMPLANT
SUT BONE WAX W31G (SUTURE) ×2 IMPLANT
SUT MON AB 3-0 SH 27 (SUTURE) ×1
SUT MON AB 3-0 SH27 (SUTURE) ×1 IMPLANT
SUT SILK 2 0 (SUTURE)
SUT SILK 2-0 18XBRD TIE 12 (SUTURE) IMPLANT
SUT VIC AB 2-0 CT1 18 (SUTURE) ×2 IMPLANT
SYR BULB IRRIGATION 50ML (SYRINGE) ×2 IMPLANT
SYR CONTROL 10ML LL (SYRINGE) ×2 IMPLANT
TAPE CLOTH 4X10 WHT NS (GAUZE/BANDAGES/DRESSINGS) ×2 IMPLANT
TAPE UMBILICAL COTTON 1/8X30 (MISCELLANEOUS) ×2 IMPLANT
TOWEL OR 17X24 6PK STRL BLUE (TOWEL DISPOSABLE) ×2 IMPLANT
TOWEL OR 17X26 10 PK STRL BLUE (TOWEL DISPOSABLE) ×2 IMPLANT
WATER STERILE IRR 1000ML POUR (IV SOLUTION) ×2 IMPLANT

## 2015-02-07 NOTE — Anesthesia Procedure Notes (Signed)
Procedure Name: Intubation Date/Time: 02/07/2015 3:31 PM Performed by: Glo Herring B Pre-anesthesia Checklist: Patient identified, Timeout performed, Emergency Drugs available, Suction available and Patient being monitored Patient Re-evaluated:Patient Re-evaluated prior to inductionOxygen Delivery Method: Circle system utilized Preoxygenation: Pre-oxygenation with 100% oxygen Intubation Type: IV induction Ventilation: Mask ventilation without difficulty Laryngoscope Size: Mac and 3 Grade View: Grade I Tube type: Oral Tube size: 7.0 mm Number of attempts: 1 Airway Equipment and Method: Stylet Placement Confirmation: CO2 detector,  positive ETCO2,  ETT inserted through vocal cords under direct vision and breath sounds checked- equal and bilateral Secured at: 21 cm Tube secured with: Tape Dental Injury: Teeth and Oropharynx as per pre-operative assessment

## 2015-02-07 NOTE — Transfer of Care (Signed)
Immediate Anesthesia Transfer of Care Note  Patient: Ellen Acosta  Procedure(s) Performed: Procedure(s): ANTERIOR CERVICAL DISCECTOMY FUSION C5-C7    ( 2 LEVELS) (N/A)  Patient Location: PACU  Anesthesia Type:General  Level of Consciousness: awake, alert  and oriented  Airway & Oxygen Therapy: Patient Spontanous Breathing and Patient connected to nasal cannula oxygen  Post-op Assessment: Report given to RN and Post -op Vital signs reviewed and stable  Post vital signs: Reviewed and stable  Last Vitals:  Filed Vitals:   02/07/15 1835  BP:   Pulse: 118  Temp: 37.2 C  Resp: 21    Complications: No apparent anesthesia complications

## 2015-02-07 NOTE — Discharge Instructions (Signed)

## 2015-02-07 NOTE — H&P (Signed)
History of Present Illness  The patient is a 54 year old female who presents today for follow up of their neck. The patient is being followed for their neck pain. They are 1 year(s) out from when symptoms began. Symptoms reported today include: pain (left side and posteriorly), pain at night, aching, weakness (left arm) and numbness (left posterior lateral upper arm to the elbow with tingling in the hand). The patient feels that they are doing well (90% better) and report their pain level to be 1 / 10 (right now, "last pm it was out of control"). Current treatment includes: relative rest, activity modification and pain medications. The following medication has been used for pain control: antiinflammatory medication and Oxycodone (10/325 qid "I take 1/2 tab at a time b/c of nausea"). The patient presents today following MRI (C-spine @ [facility] 12-12-14) and 2 weeks s/p C7 SNRB. She noted 75% decreased pain for 5 days. Then her pain returned. She said on Satur[place] her pain stopped again. She recently went back on Cymbalta for depression. she battled this in the past. She has not need pain medication for 3 days.  Allergies  No Known Drug Allergies09/25/2015  Family History  Hypertension Father. Heart Disease Father. Cancer Mother.  Social History  Current drinker 01/25/2014: Currently drinks beer and wine only occasionally per week Tobacco use Never smoker. 01/25/2014  Medication History Hydrochlorothiazide (  Tablet, Oral) Active. (qd) Low-Ogestrel (0.3-30MG -MCG Tablet, Oral) Active. (qd) Percocet (10-325MG  Tablet, 1 (one) Oral four times daily, as needed, Taken starting 01/03/2015) Active. Promethazine HCl (12.5MG  Tablet, 1 (one) Oral two times daily, as needed, Taken starting 12/26/2014) Active. Toprol XL (  Tablet ER 24HR, Oral) Active. (qd) ClomiPRAMINE HCl (Oral) Specific dose unknown - Active. DULoxetine HCl (Oral) Specific dose unknown - Active.   Physical Exam   General General Appearance-Not in acute distress. Orientation-Oriented X3. Build & Nutrition-Well nourished and Well developed.  Integumentary General Characteristics Surgical Scars - no surgical scar evidence of previous cervical surgery. Cervical Spine-Skin examination of the cervical spine is without deformity, skin lesions, lacerations or abrasions.  Peripheral Vascular Upper Extremity Palpation - Radial pulse - Bilateral - 2+.  Neurologic Sensation Upper Extremity - sensation decreased R side - numbness and pain Reflexes Biceps Reflex - Bilateral - 2+. Brachioradialis Reflex - Bilateral - 2+. Triceps Reflex - Bilateral - 2+. Hoffman's Sign - Bilateral - Hoffman's sign not present.  Musculoskeletal Spine/Ribs/Pelvis  Cervical Spine : Inspection and Palpation - Tenderness - no soft tissue tenderness to palpation and no bony tenderness to palpation, bony/soft tissue palpation of the cervical spine and shoulders does not recreate their typical pain. Strength and Tone: Strength - Deltoid - Bilateral - 5/5. Biceps - Bilateral - 5/5. Triceps - Bilateral - 5/5. Wrist Extension - Bilateral - 5/5. Hand Grip - Bilateral - 5/5. Heel walk - Bilateral - able to heel walk without difficulty. Toe Walk - Bilateral - able to walk on toes without difficulty. Heel-Toe Walk - Bilateral - able to heel-toe walk without difficulty. ROM - Flexion - Full. Extension - Full. Left Lateral Flexion - Full. Right Lateral Flexion - Full. Left Rotation - Full. Right Rotation - Full. Pain - neither flexion or extension is more painful than the other. Cervical Spine - Special Testing - axial compression test negative, cross chest impingement test negative. Non-Anatomic Signs - No non-anatomic signs present. Upper Extremity Range of Motion - No truesholder pain with IR/ER of the shoulders.  Assessment & Plan   At this point in  time, we have had a long discussion about surgery. 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 She had about five to seven days of near complete relief with the right the right C7 selective nerve root block. While there are degenerative changes at C3-4 and C4-5, she has no significant collapse on plain x-rays of those two discs nor does she have any C4 or C5 radicular symptoms. She has predominantly C7 right-sided nerve irritation and complaints but she has marked compression of the C5-6 and C6-7 discs. If I were to proceed with surgery, I think a two-level operation is the best option. This would allow me to address C5-6, C6-7 degenerative disease, stabilize those levels. If I just did one level at C6-7, my concern is that more than likely, she will have adjacent segment disease relatively huge, which will produce C6 nerve pain and would require another operation. I have gone over the risks, which include infection, bleeding, nerve damage, death, stroke, paralysis, failure to heal, need for further surgery, ongoing or worse pain, nonunion, need for posterior supplemental fusion, throat pain, swallowing difficulties, hoarseness in the voice. All of her and her husband's questions were addressed. They will take some time to think about their options and contact me and let me know how they wanted to proceed.

## 2015-02-07 NOTE — Anesthesia Preprocedure Evaluation (Signed)
Anesthesia Evaluation  Patient identified by MRN, date of birth, ID band Patient awake    Reviewed: Allergy & Precautions, NPO status , Patient's Chart, lab work & pertinent test results  Airway Mallampati: II   Neck ROM: limited    Dental   Pulmonary neg pulmonary ROS,    breath sounds clear to auscultation       Cardiovascular hypertension,  Rhythm:regular Rate:Normal     Neuro/Psych  Headaches, PSYCHIATRIC DISORDERS Depression    GI/Hepatic   Endo/Other  obese  Renal/GU      Musculoskeletal   Abdominal   Peds  Hematology   Anesthesia Other Findings   Reproductive/Obstetrics                             Anesthesia Physical Anesthesia Plan  ASA: II  Anesthesia Plan: General   Post-op Pain Management:    Induction: Intravenous  Airway Management Planned: Oral ETT  Additional Equipment:   Intra-op Plan:   Post-operative Plan: Extubation in OR  Informed Consent: I have reviewed the patients History and Physical, chart, labs and discussed the procedure including the risks, benefits and alternatives for the proposed anesthesia with the patient or authorized representative who has indicated his/her understanding and acceptance.     Plan Discussed with: CRNA, Anesthesiologist and Surgeon  Anesthesia Plan Comments:         Anesthesia Quick Evaluation

## 2015-02-07 NOTE — Brief Op Note (Signed)
02/07/2015  6:17 PM  PATIENT:  Ellen Acosta  54 y.o. female  PRE-OPERATIVE DIAGNOSIS:  CERVICAL  SPONDYLOTIC RADICULOPATHY   POST-OPERATIVE DIAGNOSIS:  CERVICAL  SPONDYLOTIC RADICULOPATHY   PROCEDURE:  Procedure(s): ANTERIOR CERVICAL DISCECTOMY FUSION C5-C7    ( 2 LEVELS) (N/A)  SURGEON:  Surgeon(s) and Role:    * Venita Lick, MD - Primary  PHYSICIAN ASSISTANT:   ASSISTANTS: Carmen Mayo   ANESTHESIA:   general  EBL:  Total I/O In: 1700 [I.V.:1700] Out: 75 [Blood:75]  BLOOD ADMINISTERED:none  DRAINS: none   LOCAL MEDICATIONS USED:  MARCAINE     SPECIMEN:  No Specimen  DISPOSITION OF SPECIMEN:  N/A  COUNTS:  YES  TOURNIQUET:  * No tourniquets in log *  DICTATION: .Other Dictation: Dictation Number [id number]  PLAN OF CARE: Admit for overnight observation  PATIENT DISPOSITION:  PACU - hemodynamically stable.

## 2015-02-08 ENCOUNTER — Encounter (HOSPITAL_COMMUNITY): Payer: Self-pay | Admitting: Orthopedic Surgery

## 2015-02-08 DIAGNOSIS — M4722 Other spondylosis with radiculopathy, cervical region: Secondary | ICD-10-CM | POA: Diagnosis not present

## 2015-02-08 NOTE — Op Note (Signed)
NAMETERRISA, CURFMAN NO.:  [id number]  MEDICAL RECORD NO.:  [id number]  LOCATION:  3C11C                        FACILITY:  [facility]  PHYSICIAN:  Dahari D. Shon Baton, M.D. 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  POSTOPERATIVE DIAGNOSIS:  Cervical spondylotic radiculopathy, C5-6 and C6-7.  OPERATIVE PROCEDURE:  Anterior cervical diskectomy and fusion, C5-6 and C6-7.  COMPLICATIONS:  None.  CONDITION:  Stable.  HISTORY:  This is a very pleasant 54 year old woman, who has been having severe neck and radicular arm pain.  Attempts at conservative management have failed to alleviate her symptoms.  As a result, we elected to proceed with aforementioned surgery.  INSTRUMENTATION USED:  Titan titanium intervertebral cages, 7 small lordotic cages, packed with DBX mix with an anterior cervical DePuy Skyline plate affixed into the bodies of C5 and C7 with 14-mm screws and 12-mm screws into the body of C6.  COMPLICATIONS:  None.  FIRST ASSISTANT:  [facility].  OPERATIVE NOTE:  The patient was brought to the operating room, placed supine on the operating table.  After successful induction of general anesthesia and endotracheal intubation, TED and SCDs were applied. Inflatable bag was placed underneath the scapula and the arms were secured at the side.  The anterior cervical spine was prepped and draped in a standard fashion.  Time-out was taken confirming patient, procedure, and all other pertinent important data.  Once this was completed, a lateral fluoro view was taken to identify the C6 vertebral body.  This incision site was marked out and infiltrated with 0.25% Marcaine with epinephrine.  A standard transverse incision was made starting at the midline proceeding to the left,a Smith-Robinson  approach was then taken.  I dissected sharply down to the platysma, incised the platysma in line with the skin incision and then continued dissecting sharply in the deep cervical fascia.  The omohyoid was identified and sacrificed for visualization.  I continued sharply dissecting into the deep fascia until I could visualize the carotid sheath.  This was then protected laterally with a finger.  I swept the esophagus to the right and protected with appendiceal retractor.  At this point, Kittner dissectors were used to remove the remaining prevertebral fascia exposing the anterior longitudinal ligament.  A needle was placed into the C5-6 disk and an intraoperative x-ray was taken to confirm that we were at the appropriate level.  Once this was done, bipolar electrocautery was used to mobilize the longus colli muscles from the midbody of C5 to the midbody of C7.  Once this was done, Caspar retracting blades were placed underneath the longus colli muscles.  The endotracheal cuff was deflated.  I extended the retractor and exposed 6- 7 disc space.  An annulotomy was performed with a 15 blade scalpel and the disc material was removed.  I then trimmed down the large anterior osteophytes from the inferior aspect of C6 vertebral body.  I then placed distraction pins into the bodies of C6 and C7, distracted the intervertebral space with a lamina spreader and then  maintained the distraction with the pins.  I then continued both posteriorly.  Using a nerve hook curettes and micro pituitary rongeurs, I removed all of the disk material.  I then took a 1-mm Kerrison and resected the posterior osteophytes from the inferior aspects of the C6 and C7 vertebral bodies. I then undercut the uncovertebral joints as well.  Using a fine nerve hook, I developed a plane underneath the posterior longitudinal ligament and then used my 1-mm Kerrison to resect the PLL.  This allowed me to have an adequate  decompression posteriorly of the spine.  I then rasped the endplates, so I had bleeding subchondral bone.  I then trialed the space and then tapped a 7 small Titan titanium cage into place.  I removed the C7 distraction pin and repositioned into C5.  Using the same technique that I just used, I performed another diskectomy at the C5-6 level.  Again, the PLL was taken down in similar fashion.  I undercut the osteophytes from the uncovertebral joints as well.  Again with an adequate decompression, I rasped the endplates and placed the same size graft.  The distraction pins were removed.  The holes were plugged with bone wax and then I fixed appropriate size anterior plate and then placed 14-mm screws.  All screws had excellent purchase.  I then placed the 12-mm screws into C6.  Once all screws were properly positioned, I then took x-rays, demonstrated satisfactory position of the hardware in both the AP and lateral planes as well as the grafts.  I then locked the screws to the plate according to manufacture's standards.  I irrigated the wound copiously with normal saline and used bipolar electrocautery and FloSeal to obtain and maintain hemostasis.  Once this was done, I irrigated again with normal saline, and then closed the platysma using interrupted 2-0 Vicryl sutures.  I then used a 3-0 Monocryl on the skin. Steri-Strips and a dry dressing were applied.  The patient was ultimately extubated and transferred to PACU without incident.  At the end of the case, all needle and sponge counts were correct.  First assistant was [employer], my PA.     Dahari D. Shon Baton, M.D.     DDB/MEDQ  D:  02/07/2015  T:  02/08/2015  Job:  [id number]  cc:   Debria Gar[facility], M.D

## 2015-02-08 NOTE — Anesthesia Postprocedure Evaluation (Signed)
Anesthesia Post Note  Patient: Ellen Acosta  Procedure(s) Performed: Procedure(s) (LRB): ANTERIOR CERVICAL DISCECTOMY FUSION C5-C7    ( 2 LEVELS) (N/A)  Anesthesia type: General  Patient location: PACU  Post pain: Pain level controlled  Post assessment: Post-op Vital signs reviewed  Last Vitals: BP 154/80 mmHg  Pulse 120  Temp(Src) 36.9 C (Oral)  Resp 16  Ht 5' (1.524 m)  Wt 160 lb (72.576 kg)  BMI 31.25 kg/m2  SpO2 99%  LMP 01/21/2015  Post vital signs: Reviewed  Level of consciousness: sedated  Complications: No apparent anesthesia complications

## 2015-02-08 NOTE — Progress Notes (Signed)
    Subjective: Procedure(s) (LRB): ANTERIOR CERVICAL DISCECTOMY FUSION C5-C7    ( 2 LEVELS) (N/A) 1 Day Post-Op  Patient reports pain as 2 on 0-10 scale.  Reports decreased arm pain reports incisional neck pain   Positive void Negative bowel movement Positive flatus Negative chest pain or shortness of breath  Objective: Vital signs in last 24 hours: Temp:  [98 F (36.7 C)-99 F (37.2 C)] 98 F (36.7 C) (09/30 0536) Pulse Rate:  [100-118] 115 (09/30 0536) Resp:  [15-21] 16 (09/30 0536) BP: (120-154)/(64-88) 154/86 mmHg (09/30 0536) SpO2:  [96 %-100 %] 99 % (09/30 0536) Weight:  [72.576 kg (160 lb)] 72.576 kg (160 lb) (09/29 1312)  Intake/Output from previous day: 09/29 0701 - 09/30 0700 In: 1700 [I.V.:1700] Out: 75 [Blood:75]  Labs: No results for input(s): WBC, RBC, HCT, PLT in the last 72 hours.  Recent Labs  02/06/15 1149  NA 137  K 3.2*  CL 102  CO2 23  BUN 9  CREATININE 0.54  GLUCOSE 72  CALCIUM 9.5   No results for input(s): LABPT, INR in the last 72 hours.  Physical Exam: Neurologically intact ABD soft Dorsiflexion/Plantar flexion intact Compartment soft no radicular arm pain  Assessment/Plan: Patient stable  xrays satisfactory Mobilization with physical therapy Encourage incentive spirometry Continue care  Advance diet Up with therapy  Plan on d/c to home today  F/u 2 weeks  Venita Lick, MD [facility] [phone]

## 2015-02-08 NOTE — Progress Notes (Signed)
Occupational Therapy Evaluation and Discharge Patient Details Name: Ellen Acosta MRN: [id number] DOB: 1960/12/15 Today's Date: 02/08/2015     History of Present Illness Pt is a 54 y/o female who presents s/p ACDF C5-C7 on 02/07/15.   Clinical Impression  PTA, pt independent with ADL and mobility. Completed education regarding functional mobility and ADL following compensatory techniques and availability of AE if needed. Pt ready to D/C home when medically stable. OT signing off.   Follow Up Recommendations  No OT follow up;Supervision - Intermittent    Equipment Recommendations  None recommended by OT    Recommendations for Other Services       Precautions / Restrictions Precautions Precautions: Fall;Cervical Precaution Comments: Pt educated on cervical precautions Required Braces or Orthoses: Cervical Brace Cervical Brace: Hard collar;At all times (Off for sleeping, bathing, eating) Restrictions Weight Bearing Restrictions: No      Mobility Bed Mobility Overal bed mobility: Modified Independent Bed Mobility: Rolling;Sidelying to Sit Rolling: Modified independent (Device/Increase time) Sidelying to sit: Modified independent (Device/Increase time)       General bed mobility comments: VC's initially for sequencing/technique.   Transfers Overall transfer level: Needs assistance Equipment used: None Transfers: Sit to/from Stand Sit to Stand: Modified independent (Device/Increase time)         General transfer comment: good return demonstration of techniques taught earlier by PT    Balance Overall balance assessment: No apparent balance deficits (not formally assessed)                                          ADL Overall ADL's : Needs assistance/impaired                                     Functional mobility during ADLs: Independent General ADL Comments: Completed education regarding compensatory techniques for ADL. Pt  able to return demonstrate techniques. Written information reviewed.      Vision     Perception     Praxis      Pertinent Vitals/Pain Pain Assessment: 0-10 Pain Score: 3  Pain Location: neck Pain Descriptors / Indicators: Discomfort Pain Intervention(s): Limited activity within patient's tolerance     Hand Dominance Right   Extremity/Trunk Assessment Upper Extremity Assessment Upper Extremity Assessment: Overall WFL for tasks assessed   Lower Extremity Assessment Lower Extremity Assessment: Overall WFL for tasks assessed   Cervical / Trunk Assessment Cervical / Trunk Assessment: Other exceptions Cervical / Trunk Exceptions: Cervical incision   Communication Communication Communication: No difficulties   Cognition Arousal/Alertness: Awake/alert Behavior During Therapy: WFL for tasks assessed/performed Overall Cognitive Status: Within Functional Limits for tasks assessed                     General Comments       Exercises       Shoulder Instructions      Home Living Family/patient expects to be discharged to:: Private residence Living Arrangements: Spouse/significant other Available Help at Discharge: Family;Available 24 hours/day Type of Home: House Home Access: Stairs to enter [employer] of Steps: 2 Entrance Stairs-Rails: Right Home Layout: Two level Alternate Level Stairs-Number of Steps: Flight Alternate Level Stairs-Rails: Right;Left (Right 1st 1/2, Left 2nd 1/2) Bathroom Shower/Tub: Tub/shower unit;Walk-in shower     Bathroom Accessibility: Yes   Home Equipment: Shower seat  Additional Comments: States husband was looking into getting her a shower chair.       Prior Functioning/Environment Level of Independence: Independent             OT Diagnosis: Acute pain   OT Problem List: Decreased knowledge of use of DME or AE;Decreased knowledge of precautions;Pain   OT Treatment/Interventions:      OT Goals(Current  goals can be found in the care plan section) Acute Rehab OT Goals Patient Stated Goal: to go home OT Goal Formulation: All assessment and education complete, DC therapy  OT Frequency:     Barriers to D/C:            Co-evaluation              End of Session Equipment Utilized During Treatment: Cervical collar Nurse Communication: Mobility status  Activity Tolerance: Patient tolerated treatment well Patient left: in bed;with call bell/phone within reach   Time: 1610-9604 OT Time Calculation (min): 27 min Charges:  OT General Charges $OT Visit: 1 Procedure OT Evaluation $Initial OT Evaluation Tier I: 1 Procedure OT Treatments $Self Care/Home Management : 8-22 mins G-Codes:    WARD,HILLARY 02-22-15, 11:23 AM   Luisa Dago, OTR/L  [phone] 2015/02/22

## 2015-02-08 NOTE — Evaluation (Signed)
Physical Therapy Evaluation and Discharge Patient Details Name: Ellen Acosta MRN: [id number] DOB: 1961/02/20 Today's Date: 02/08/2015   History of Present Illness  Pt is a 54 y/o female who presents s/p ACDF C5-C7 on 02/07/15.  Clinical Impression  Patient evaluated by Physical Therapy with no further acute PT needs identified. All education has been completed and the patient has no further questions. At the time of PT eval pt was grossly functioning at a modified independent level. She was able to negotiate a flight of stairs safely to demonstrate mobility at home. See below for any follow-up Physial Therapy or equipment needs. PT is signing off. Thank you for this referral.      Follow Up Recommendations No PT follow up    Equipment Recommendations  None recommended by PT    Recommendations for Other Services       Precautions / Restrictions Precautions Precautions: Fall;Cervical Precaution Comments: Pt educated on cervical precautions Required Braces or Orthoses: Cervical Brace Cervical Brace: Hard collar;At all times (Off for sleeping, bathing, eating) Restrictions Weight Bearing Restrictions: No      Mobility  Bed Mobility Overal bed mobility: Modified Independent Bed Mobility: Rolling;Sidelying to Sit Rolling: Modified independent (Device/Increase time) Sidelying to sit: Modified independent (Device/Increase time)       General bed mobility comments: VC's initially for sequencing/technique.   Transfers Overall transfer level: Needs assistance Equipment used: None Transfers: Sit to/from Stand Sit to Stand: Supervision;Modified independent (Device/Increase time)         General transfer comment: Supervision for safety initially but grossly modified independent for transfers.   Ambulation/Gait Ambulation/Gait assistance: Supervision;Modified independent (Device/Increase time) Ambulation Distance (Feet): 400 Feet Assistive device: None Gait  Pattern/deviations: WFL(Within Functional Limits)   Gait velocity interpretation: at or above normal speed for age/gender General Gait Details: Supervision initially for safety but progressed quickly to modified independent.   Stairs Stairs: Yes Stairs assistance: Modified independent (Device/Increase time) Stair Management: One rail Right Number of Stairs: 10 General stair comments: VC's for technique and safety.   Wheelchair Mobility    Modified Rankin (Stroke Patients Only)       Balance Overall balance assessment: No apparent balance deficits (not formally assessed)                                           Pertinent Vitals/Pain Pain Assessment: No/denies pain    Home Living Family/patient expects to be discharged to:: Private residence Living Arrangements: Spouse/significant other Available Help at Discharge: Family;Available 24 hours/day Type of Home: House Home Access: Stairs to enter Entrance Stairs-Rails: Right Entrance Stairs-Number of Steps: 2 Home Layout: Two level Home Equipment: None Additional Comments: States husband was looking into getting her a shower chair.     Prior Function Level of Independence: Independent               Hand Dominance   Dominant Hand: Right    Extremity/Trunk Assessment   Upper Extremity Assessment: Defer to OT evaluation           Lower Extremity Assessment: Overall WFL for tasks assessed      Cervical / Trunk Assessment: Other exceptions  Communication   Communication: No difficulties  Cognition Arousal/Alertness: Awake/alert Behavior During Therapy: WFL for tasks assessed/performed Overall Cognitive Status: Within Functional Limits for tasks assessed  General Comments      Exercises        Assessment/Plan    PT Assessment Patent does not need any further PT services  PT Diagnosis Acute pain   PT Problem List    PT Treatment Interventions      PT Goals (Current goals can be found in the Care Plan section) Acute Rehab PT Goals PT Goal Formulation: All assessment and education complete, DC therapy    Frequency     Barriers to discharge        Co-evaluation               End of Session Equipment Utilized During Treatment: Cervical collar Activity Tolerance: Patient tolerated treatment well Patient left: in chair;with call bell/phone within reach Nurse Communication: Mobility status    Functional Assessment Tool Used: Clinical judgement Functional Limitation: Mobility: Walking and moving around Mobility: Walking and Moving Around Current Status (Q4696): At least 1 percent but less than 20 percent impaired, limited or restricted Mobility: Walking and Moving Around Goal Status [phone]): At least 1 percent but less than 20 percent impaired, limited or restricted    Time: 0735-0753 PT Time Calculation (min) (ACUTE ONLY): 18 min   Charges:   PT Evaluation $Initial PT Evaluation Tier I: 1 Procedure     PT G Codes:   PT G-Codes **NOT FOR INPATIENT CLASS** Functional Assessment Tool Used: Clinical judgement Functional Limitation: Mobility: Walking and moving around Mobility: Walking and Moving Around Current Status (U1324): At least 1 percent but less than 20 percent impaired, limited or restricted Mobility: Walking and Moving Around Goal Status [phone]): At least 1 percent but less than 20 percent impaired, limited or restricted    Conni Slipper 02/08/2015, 10:34 AM  Conni Slipper, PT, DPT Acute Rehabilitation Services Pager: [phone]

## 2015-02-08 NOTE — Progress Notes (Signed)
Pt given D/C instructions with Rx's, verbal understanding was provided. Pt's incision is clean and dry with no sign of infection. Pt's IV was removed prior to D/C. Pt D/C'd home via wheelchair @ 1210 per MD order. Pt is stable @ D/C and has no other needs at this time. Rema Fendt, RN

## 2015-02-11 NOTE — Progress Notes (Signed)
OT Note - Addendum    02/08/15 1125  OT Visit Information  Last OT Received On 02-13-15  OT G-codes **NOT FOR INPATIENT CLASS**  Functional Assessment Tool Used clinical judgement  Functional Limitation Self care  Self Care Current Status [phone]) CI  Self Care Goal Status (U0454) CI  Self Care Discharge Status [phone]) CI  [facility], OTR/L  [phone] 02/13/2015

## 2015-02-13 NOTE — Discharge Summary (Signed)
Physician Discharge Summary  Patient ID: Ellen Acosta MRN: [id number] DOB/AGE: 53-Dec-1962 53 y.o.  Admit date: 02/07/2015 Discharge date: 02/13/2015  Admission Diagnoses:  <principal problem not specified>  Discharge Diagnoses:  Active Problems:   Neck pain   Past Medical History  Diagnosis Date  . Hypertension   . Headache(784.0)   . Depression   . IBS (irritable bowel syndrome)   . Hyperlipemia   . Chronic neck pain     cervical spondyliothesis, disc disease, radiculopathy    Surgeries: Procedure(s): ANTERIOR CERVICAL DISCECTOMY FUSION C5-C7    ( 2 LEVELS) on 02/07/2015   Consultants (if any):    Discharged Condition: Improved  Hospital Course: Ellen Acosta is an 54 y.o. female who was admitted 02/07/2015 with a diagnosis of Cervical pain and left arm radicular pain and went to the operating room on 02/07/2015 and underwent the above named procedures.  The pts hospital course was uneventful. She was discharged on 02/08/15.  She was given perioperative antibiotics:  Anti-infectives    Start     Dose/Rate Route Frequency Ordered Stop   02/07/15 2200  ceFAZolin (ANCEF) IVPB 1 g/50 mL premix     1 g 100 mL/hr over 30 Minutes Intravenous Every 8 hours 02/07/15 2024 02/08/15 0554   02/07/15 1400  ceFAZolin (ANCEF) IVPB 2 g/50 mL premix     2 g 100 mL/hr over 30 Minutes Intravenous To [facility] 02/06/15 0913 02/07/15 1539    .  She was given sequential compression devices, early ambulation, and TED for DVT prophylaxis.  She benefited maximally from the hospital stay and there were no complications.    Recent vital signs:  Filed Vitals:   02/08/15 0804  BP: 154/80  Pulse: 120  Temp: 98.4 F (36.9 C)  Resp: 16    Recent laboratory studies:  Lab Results  Component Value Date   HGB 14.8 02/19/2010   Lab Results  Component Value Date   WBC 7.5 02/19/2010   PLT 307 02/19/2010   No results found for: INR Lab Results  Component Value Date   NA  137 02/06/2015   K 3.2* 02/06/2015   CL 102 02/06/2015   CO2 23 02/06/2015   BUN 9 02/06/2015   CREATININE 0.54 02/06/2015   GLUCOSE 72 02/06/2015    Discharge Medications:     Medication List    TAKE these medications        ALPRAZolam 0.5 MG tablet  Commonly known as:  XANAX  Take 0.5 mg by mouth 2 (two) times daily as needed for anxiety.     clomiPRAMINE 50 MG capsule  Commonly known as:  ANAFRANIL  Take 50 mg by mouth at bedtime.     DULoxetine 30 MG capsule  Commonly known as:  CYMBALTA  Take 30 mg by mouth daily.     hydrochlorothiazide 25 MG tablet  Commonly known as:  HYDRODIURIL  Take 25 mg by mouth every morning.     methocarbamol 500 MG tablet  Commonly known as:  ROBAXIN  Take 1 tablet (500 mg total) by mouth 3 (three) times daily as needed for muscle spasms.     metoprolol succinate 50 MG 24 hr tablet  Commonly known as:  TOPROL-XL  Take 50 mg by mouth every morning. Take with or immediately following a meal.     norgestrel-ethinyl estradiol 0.3-30 MG-MCG tablet  Commonly known as:  LO/OVRAL,CRYSELLE  Take 1 tablet by mouth daily.     ondansetron 4 MG tablet  Commonly  known as:  ZOFRAN  Take 1 tablet (4 mg total) by mouth every 8 (eight) hours as needed for nausea or vomiting.     oxyCODONE-acetaminophen 10-325 MG tablet  Commonly known as:  PERCOCET  Take 1 tablet by mouth every 4 (four) hours as needed for pain.        Diagnostic Studies: Dg Cervical Spine 2 Or 3 Views  02/07/2015   CLINICAL DATA:  Postop C5-7 ACDF.  EXAM: CERVICAL SPINE  4+ VIEWS  COMPARISON:  Intraoperative views same date.  FINDINGS: The C5-7 ACDF appears unchanged. The hardware is well positioned. The alignment is normal. There is mild prevertebral soft tissue swelling inferiorly.  IMPRESSION: Intact hardware and stable alignment status post C5-7 ACDF. No demonstrated complication.   Electronically Signed   By: Carey Bullocks M.D.   On: 02/07/2015 21:11   Dg Cervical Spine  2-3 Views  02/07/2015   CLINICAL DATA:  54 year old female status post ACDF at 2 levels.  EXAM: DG C-ARM 61-120 MIN; CERVICAL SPINE - 2-3 VIEW  COMPARISON:  None.  FINDINGS: Three intraoperative fluoroscopic views of the cervical spine demonstrate postoperative changes of anterior cervical discectomy and fusion from C5-C7 with interbody cages at C5-C6 and C6-C7. Alignment appears anatomic.  IMPRESSION: 1. Intraoperative documentation of ACDF at C5-C7, as above.   Electronically Signed   By: Trudie Reed M.D.   On: 02/07/2015 18:18   Dg C-arm 61-120 Min  02/07/2015   CLINICAL DATA:  54 year old female status post ACDF at 2 levels.  EXAM: DG C-ARM 61-120 MIN; CERVICAL SPINE - 2-3 VIEW  COMPARISON:  None.  FINDINGS: Three intraoperative fluoroscopic views of the cervical spine demonstrate postoperative changes of anterior cervical discectomy and fusion from C5-C7 with interbody cages at C5-C6 and C6-C7. Alignment appears anatomic.  IMPRESSION: 1. Intraoperative documentation of ACDF at C5-C7, as above.   Electronically Signed   By: Trudie Reed M.D.   On: 02/07/2015 18:18    Disposition: 01-Home or Self Care        Follow-up Information    Follow up with Alvy Beal, MD In 2 weeks.   Specialty:  Orthopedic Surgery   Why:  If symptoms worsen, For suture removal, For wound re-check   Contact information:   [address] [place] [address] [phone]        Signed: Kirt Boys 02/13/2015, 7:53 AM

## 2016-11-10 IMAGING — CR DG CERVICAL SPINE 2 OR 3 VIEWS
3 series · 3 of 3 positions shown · non-contrast
Comparison: Intraoperative views same date.

CLINICAL DATA: Postop C5-7 ACDF.

EXAM:
CERVICAL SPINE  4+ VIEWS

[c-spine lat]
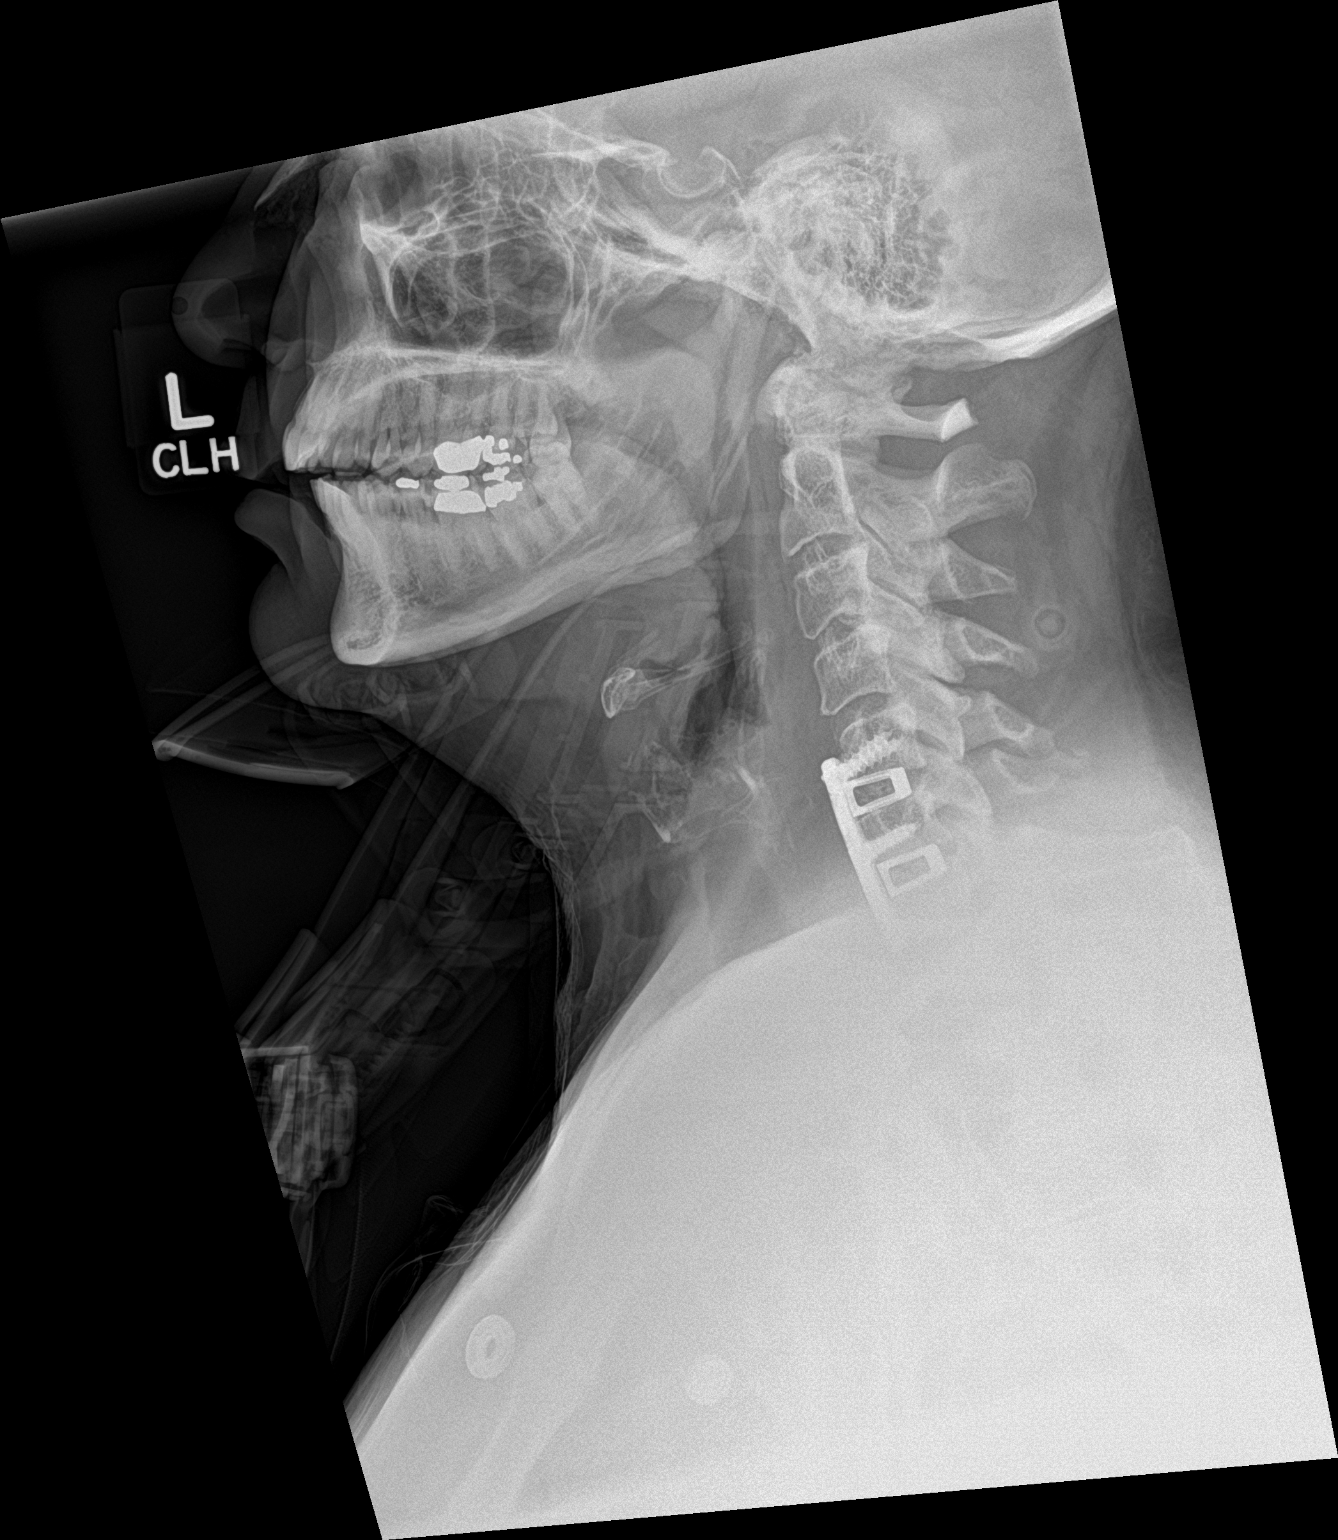

[c-spine ap]
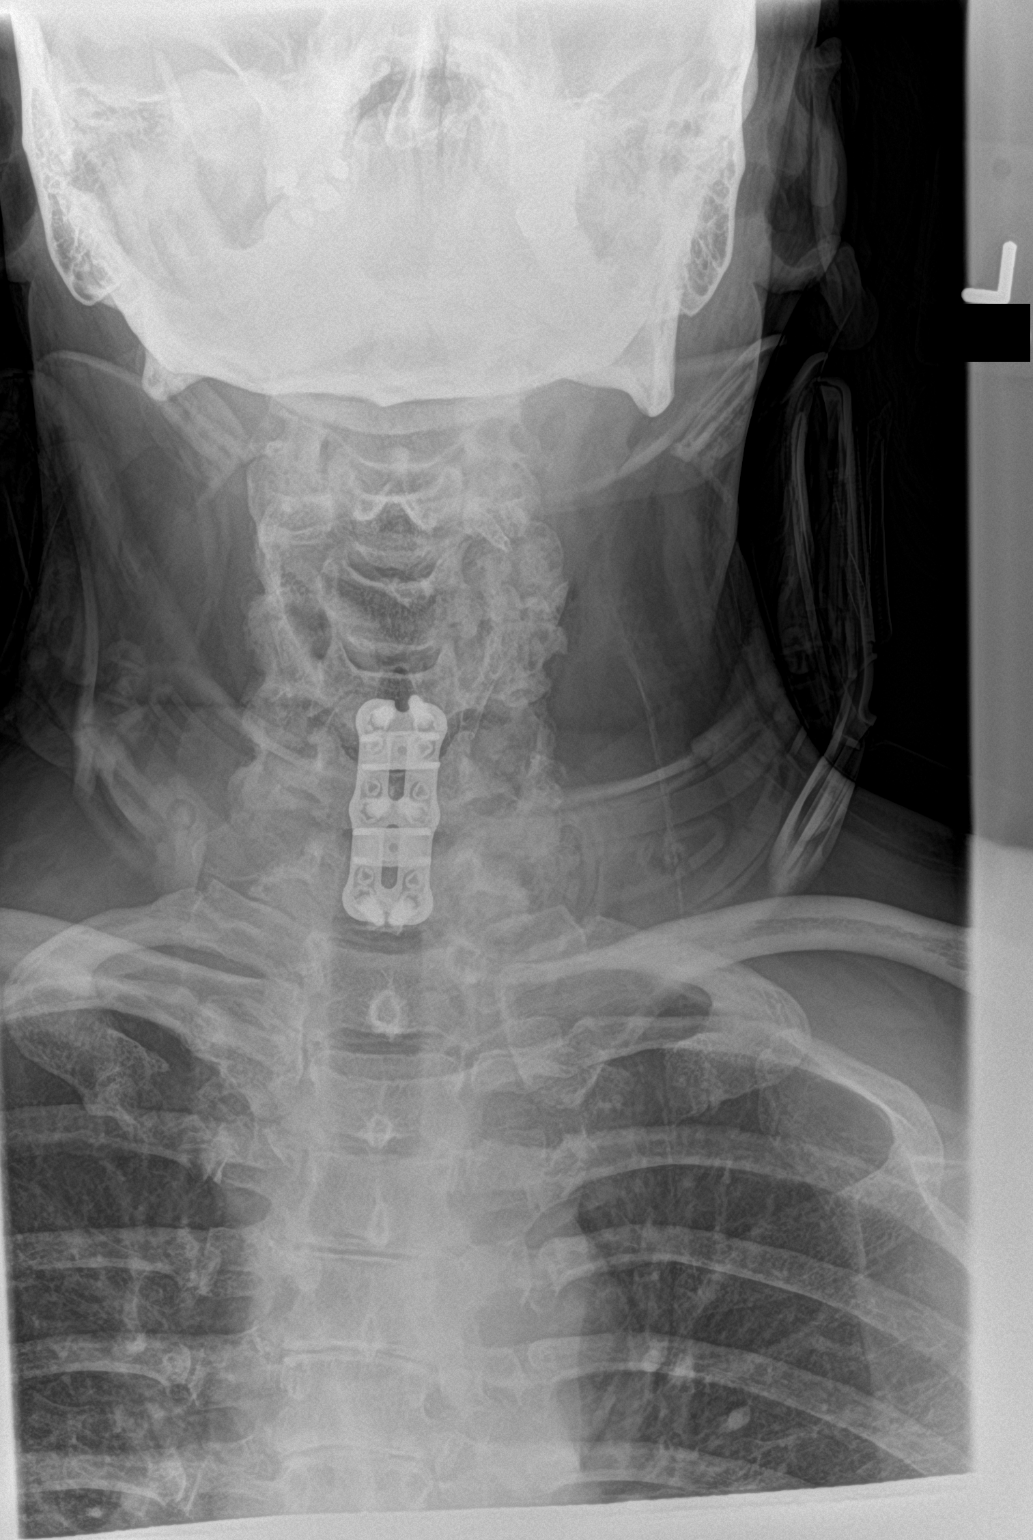

[c-spine swimmers]
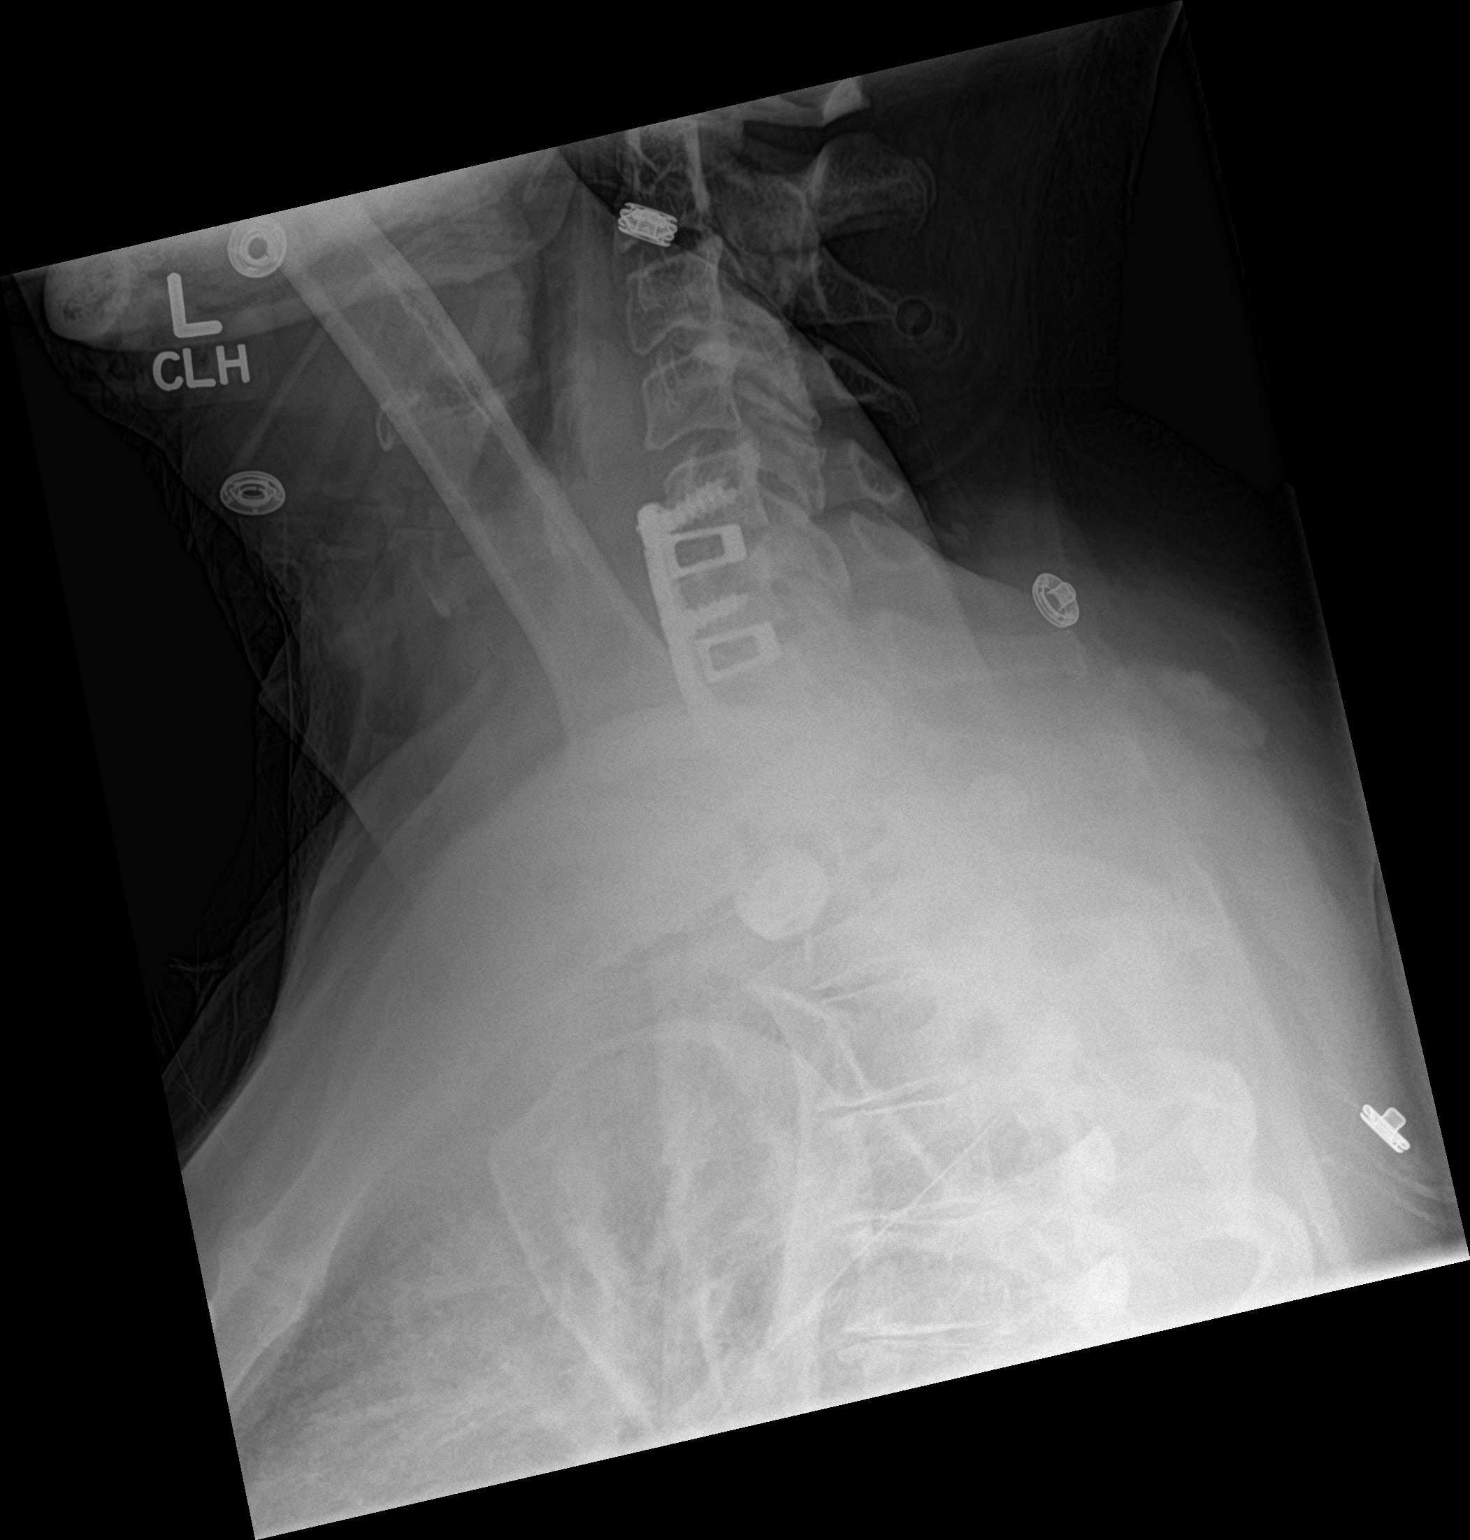

[3 of 3 positions shown; findings below may reference images not displayed]

FINDINGS: The C5-7 ACDF appears unchanged. The hardware is well positioned.
The alignment is normal. There is mild prevertebral soft tissue
swelling inferiorly.
IMPRESSION: Intact hardware and stable alignment status post C5-7 ACDF. No
demonstrated complication.

## 2016-11-10 IMAGING — RF DG C-ARM 61-120 MIN
1 series · 3 of 3 positions shown · non-contrast
Comparison: None.

CLINICAL DATA: 53-year-old female status post ACDF at 2 levels.

EXAM:
DG C-ARM 61-120 MIN; CERVICAL SPINE - 2-3 VIEW

[Series 1: run · 3 of 3 slices shown]
[im 1/3]
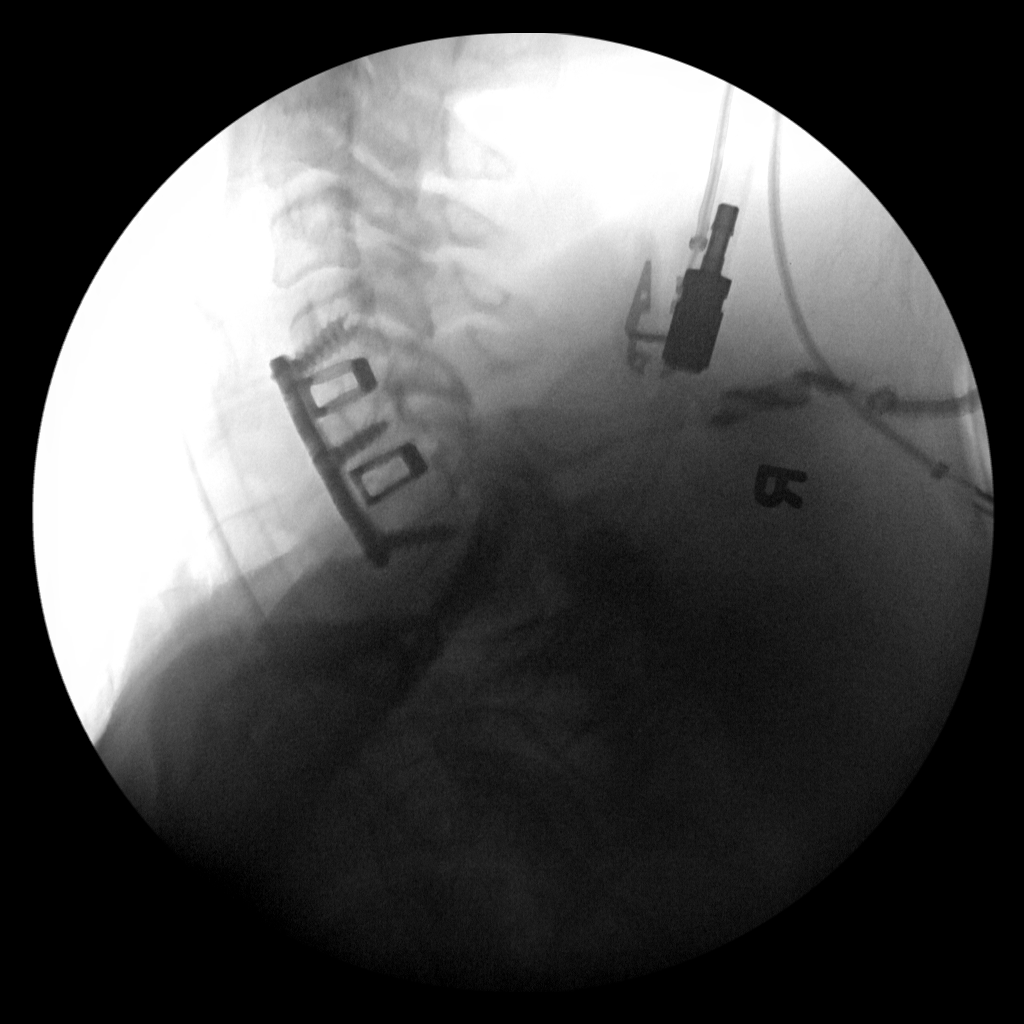
[im 2/3]
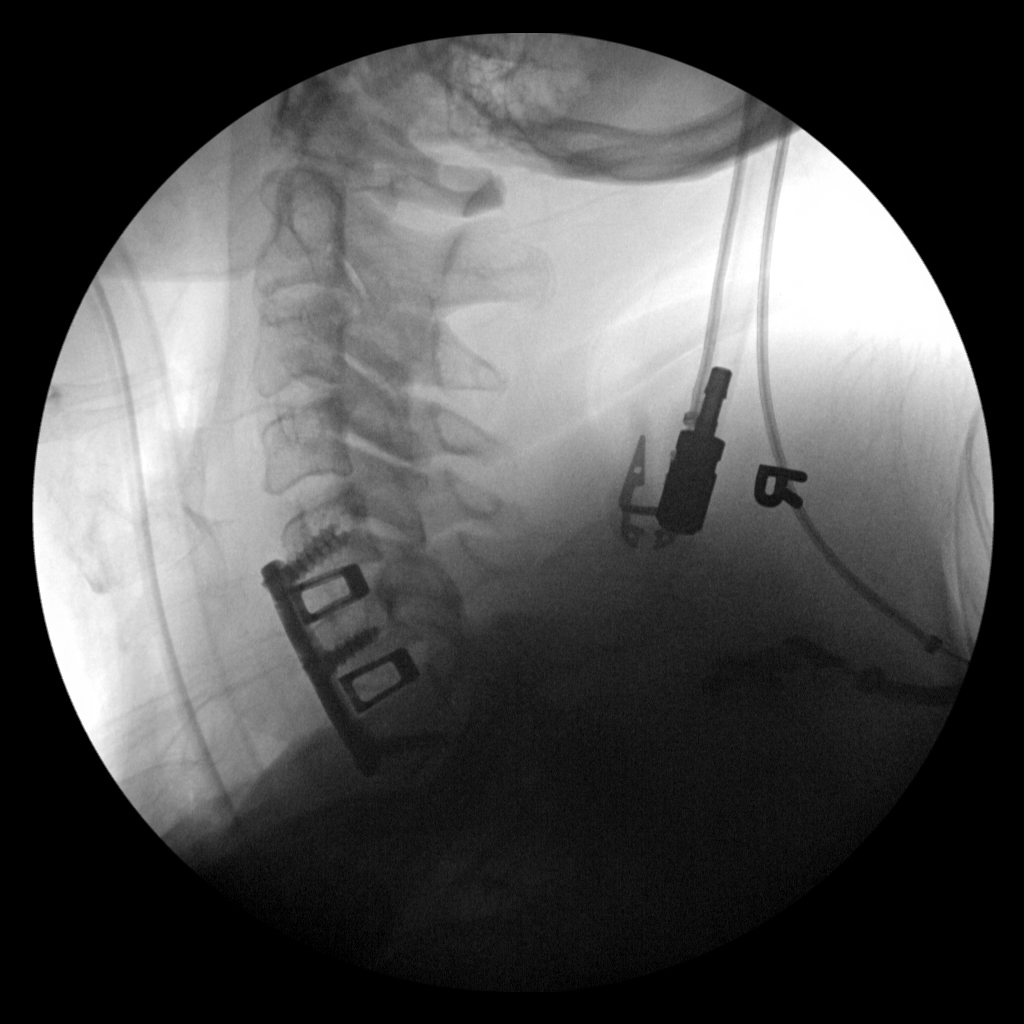
[im 3/3]
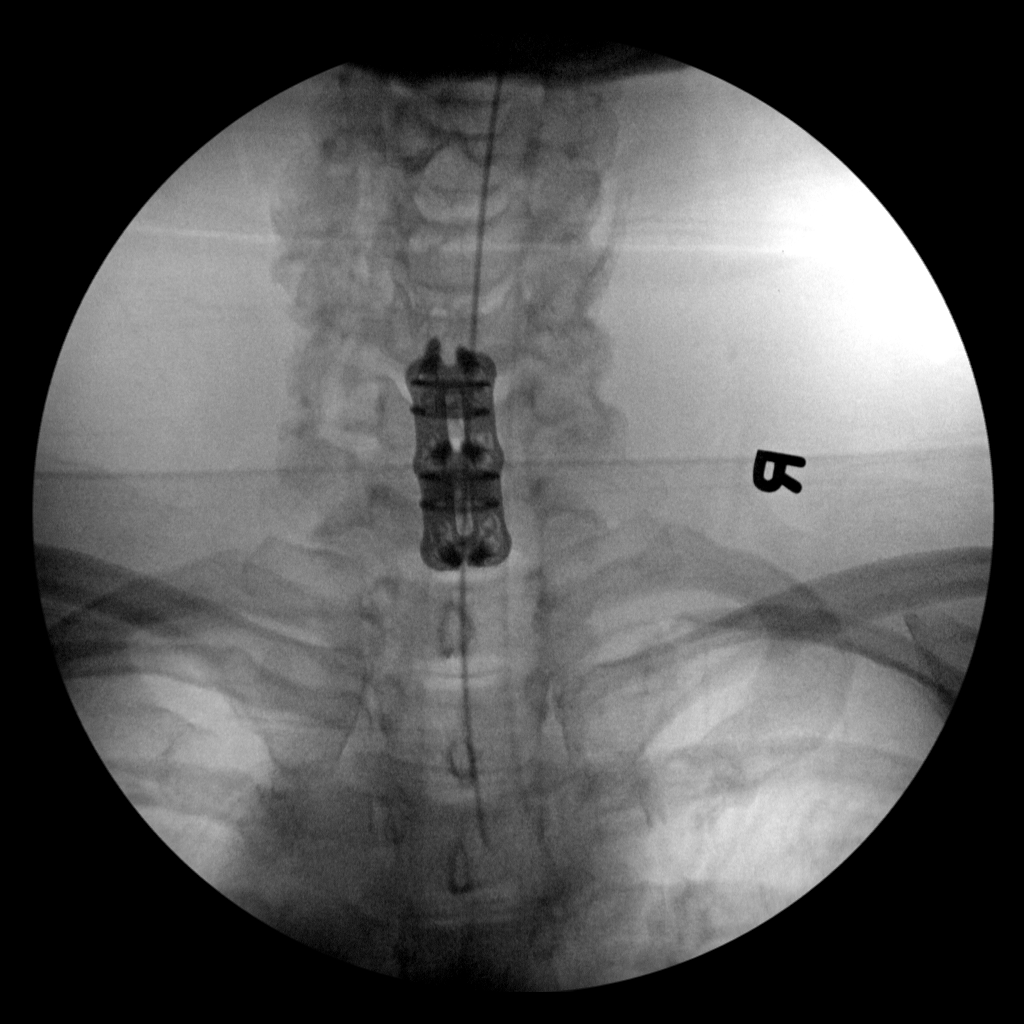

[3 of 3 positions shown; findings below may reference images not displayed]

FINDINGS: Three intraoperative fluoroscopic views of the cervical spine
demonstrate postoperative changes of anterior cervical discectomy
and fusion from C5-C7 with interbody cages at C5-C6 and C6-C7.
Alignment appears anatomic.
IMPRESSION: 1. Intraoperative documentation of ACDF at C5-C7, as above.

## 2017-07-22 ENCOUNTER — Encounter: Payer: Self-pay | Admitting: Primary Care

## 2017-08-24 ENCOUNTER — Ambulatory Visit: Payer: BLUE CROSS/BLUE SHIELD | Admitting: Primary Care

## 2017-08-24 ENCOUNTER — Encounter: Payer: Self-pay | Admitting: Primary Care

## 2017-08-24 VITALS — BP 146/80 | HR 106 | Temp 98.1°F | Ht 58.5 in | Wt 165.0 lb

## 2017-08-24 DIAGNOSIS — R7303 Prediabetes: Secondary | ICD-10-CM | POA: Insufficient documentation

## 2017-08-24 DIAGNOSIS — F329 Major depressive disorder, single episode, unspecified: Secondary | ICD-10-CM | POA: Diagnosis not present

## 2017-08-24 DIAGNOSIS — E538 Deficiency of other specified B group vitamins: Secondary | ICD-10-CM

## 2017-08-24 DIAGNOSIS — E785 Hyperlipidemia, unspecified: Secondary | ICD-10-CM | POA: Diagnosis not present

## 2017-08-24 DIAGNOSIS — K580 Irritable bowel syndrome with diarrhea: Secondary | ICD-10-CM | POA: Diagnosis not present

## 2017-08-24 DIAGNOSIS — I10 Essential (primary) hypertension: Secondary | ICD-10-CM | POA: Insufficient documentation

## 2017-08-24 MED ORDER — DICYCLOMINE HCL 10 MG PO CAPS: ORAL_CAPSULE | ORAL | 0 refills | Status: DC

## 2017-08-24 MED ORDER — HYDROCHLOROTHIAZIDE 25 MG PO TABS: ORAL_TABLET | ORAL | 0 refills | Status: DC

## 2017-08-24 NOTE — Assessment & Plan Note (Signed)
Prior history and once managed on Toprol and HCTZ. Given numerous elevated readings, will restart HCTZ 25 mg. She will return in 2 weeks for BP check and BMP.

## 2017-08-24 NOTE — Assessment & Plan Note (Signed)
Recent A1C of 5.9.  Discussed to work on improvements in diet and to start exercising regularly in order for weight loss.   Repeat A1C in 3-6 months.

## 2017-08-24 NOTE — Patient Instructions (Signed)
It's important to improve your diet by reducing consumption of fast food, fried food, processed snack foods, sugary drinks. Increase consumption of fresh vegetables and fruits, whole grains, water.  Ensure you are drinking 64 ounces of water daily.  Start exercising. You should be getting 150 minutes of moderate intensity exercise weekly.  Increase fiber in your diet, limit caffeine and artificial sweeteners. Take a look at the information below regarding IBS diet.  You may take dicyclomine 10 mg capsules three times daily before meals as needed for abdominal cramping, diarrhea. Please update me in a few weeks.   We can send you to the GI doctor if no improvement after improvement in diet and medications.   Start taking Vitamin B 12 tablets. Take 1000 mcg once daily. This can be purchased over the counter.   Start hydrochlorothiazide 25 mg tablets for high blood pressure. Take 1 tablet by mouth once daily.  Start monitoring your blood pressure daily, around the same time of day, for the next 2 weeks.  Ensure that you have rested for 30 minutes prior to checking your blood pressure. Record your readings and bring them to your next visit.  Schedule a follow up visit in 2 weeks for blood pressure check.  It was a pleasure to meet you today! Please don't hesitate to call or message me with any questions. Welcome to [facility]!   Diet for Irritable Bowel Syndrome When you have irritable bowel syndrome (IBS), the foods you eat and your eating habits are very important. IBS may cause various symptoms, such as abdominal pain, constipation, or diarrhea. Choosing the right foods can help ease discomfort caused by these symptoms. Work with your health care provider and dietitian to find the best eating plan to help control your symptoms. What general guidelines do I need to follow?  Keep a food diary. This will help you identify foods that cause symptoms. Write down: ? What you eat and when. ? What  symptoms you have. ? When symptoms occur in relation to your meals.  Avoid foods that cause symptoms. Talk with your dietitian about other ways to get the same nutrients that are in these foods.  Eat more foods that contain fiber. Take a fiber supplement if directed by your dietitian.  Eat your meals slowly, in a relaxed setting.  Aim to eat 5-6 small meals per day. Do not skip meals.  Drink enough fluids to keep your urine clear or pale yellow.  Ask your health care provider if you should take an over-the-counter probiotic during flare-ups to help restore healthy gut bacteria.  If you have cramping or diarrhea, try making your meals low in fat and high in carbohydrates. Examples of carbohydrates are pasta, rice, whole grain breads and cereals, fruits, and vegetables.  If dairy products cause your symptoms to flare up, try eating less of them. You might be able to handle yogurt better than other dairy products because it contains bacteria that help with digestion. What foods are not recommended? The following are some foods and drinks that may worsen your symptoms:  Fatty foods, such as [place] fries.  Milk products, such as cheese or ice cream.  Chocolate.  Alcohol.  Products with caffeine, such as coffee.  Carbonated drinks, such as soda.  The items listed above may not be a complete list of foods and beverages to avoid. Contact your dietitian for more information. What foods are good sources of fiber? Your health care provider or dietitian may recommend that you  eat more foods that contain fiber. Fiber can help reduce constipation and other IBS symptoms. Add foods with fiber to your diet a little at a time so that your body can get used to them. Too much fiber at once might cause gas and swelling of your abdomen. The following are some foods that are good sources of fiber:  Apples.  Peaches.  Pears.  Berries.  Figs.  Broccoli (raw).  Cabbage.  Carrots.  Raw  peas.  Kidney beans.  Lima beans.  Whole grain bread.  Whole grain cereal.  Where to find more information: [employer] for Functional Gastrointestinal Disorders: www.iffgd.[employer] of Diabetes and Digestive and Kidney Diseases: http://norris-lawson.com/.aspx This information is not intended to replace advice given to you by your health care provider. Make sure you discuss any questions you have with your health care provider. Document Released: 07/18/2003 Document Revised: 10/03/2015 Document Reviewed: 07/28/2013 Elsevier Interactive Patient Education  2018 [employer].

## 2017-08-24 NOTE — Assessment & Plan Note (Signed)
Noted on recent lab draw. Poor diet and is not exercising. HDL is 73 which is good news, but LDL is too high.  Recommended she work on weight loss through diet and exercise and repeat lipids in 3-6 months.   ASCVD score of 2.8%.

## 2017-08-24 NOTE — Assessment & Plan Note (Signed)
Noted on recent labs, level of 207. Start oral B12 supplements at 1000 mcg. Repeat B12 in 3 months.

## 2017-08-24 NOTE — Progress Notes (Signed)
Subjective:    Patient ID: Ellen Acosta, female    DOB: 07/13/1960, 57 y.o.   MRN: [id number]  HPI  Ms. Suire is a 57 year old female who presents today to establish care and discuss the problems mentioned below. Will obtain old records.  1) Major Depressive Disorder: Currently following at [facility] and is managed on Symbax 6-25 mg, Cymbalta 30 mg, alprazolam 0.5 mg. She was sent to our practice to establish with a PCP due to recent lab work results.   2) Prediabetes: A1C of 5.9 in mid March 2019. History of gestational diabetes. Denies family history of diabetes. She denies polyuria, polydipsia, polyphagia, numbness/tigling, chest pain, weakness.   Diet currently consists of:  Breakfast: Cereal Lunch: Sandwich (home made), chips Dinner: Chicken, fish, red meat, some vegetables  Snacks: Cookies, ice cream, candy Desserts: Daily  Beverages: Diet Mountain Dew, sweet tea, little water  Exercise: She walks her dogs several times daily.    3) Hyperlipidemia: Lipid panel in mid March 2019 with TC of 280, LDL of 182, Trigs of 331, HDL of 73. She denies a personal history of hyperlipidemia. She has family history of hyperlipidemia in siblings and father.   4) Essential Hypertension: Prior history of and once managed on HCTZ 25 mg, Toprol XL 50 mg. She stopped taking her mediation two years ago as she didn't like taking this medication with her psych medications.   She's been checking her BP at home recently and is getting readings of 140/80's. She denies chest pain, dizziness, headaches.   BP Readings from Last 3 Encounters:  08/24/17 (!) 146/80  02/08/15 (!) 154/80  02/06/15 136/78   5) Vitamin B 12 Deficiency: Noted on labs in mid March 2019 with level of 207. She denies vitamin B 12 deficiency history.  She is not taking any supplemental B12 at this time.   6) IBS/Chronic Diarrhea: Present for the past 3-4 years, intermittently. She'll experience sudden  urgency of diarrhea and will experience 2-3 episodes of watery diarrhea approximately 39 minutes after a meal. This will occur 4-5 days weekly on average. She will sometimes not make it to the bathroom.   She denies bloody stools, nausea, vomiting, unexplained weight loss, esophageal burning/reflux. She does have intermittent cramping to the bilateral lower abdomen. She's identified certain foods such as dairy products, greasy foods that will increase symptoms.   She underwent colonoscopy 4-5 years ago, thinks this was normal.. She was evaluated for IBS diarrhea type in April 2016 with recommendations for diet changes, Imodium, probiotics, and Levsin. She tried Probiotics for 3-4 weeks at that time and didn't see improvement. She did start taking Imodium twice daily in 2016 and is doing this to this day. She never received a prescription for Levsin. She has never seen GI.  Review of Systems  Constitutional: Negative for unexpected weight change.  HENT: Negative for trouble swallowing.   Eyes: Negative for visual disturbance.  Respiratory: Negative for shortness of breath.   Cardiovascular: Negative for chest pain.  Gastrointestinal: Positive for diarrhea. Negative for blood in stool and nausea.  Skin: Negative for color change.  Neurological: Negative for dizziness and headaches.  Hematological: Negative for adenopathy.  Psychiatric/Behavioral:       Following with psychiatry, feels well managed       Past Medical History:  Diagnosis Date  . Chronic neck pain    cervical spondyliothesis, disc disease, radiculopathy  . Depression   . Headache(784.0)   . Hyperlipemia   .  Hypertension   . IBS (irritable bowel syndrome)      Social History   Socioeconomic History  . Marital status: Married    Spouse name: Not on file  . Number of children: Not on file  . Years of education: Not on file  . Highest education level: Not on file  Occupational History  . Not on file  Social Needs  .  Financial resource strain: Not on file  . Food insecurity:    Worry: Not on file    Inability: Not on file  . Transportation needs:    Medical: Not on file    Non-medical: Not on file  Tobacco Use  . Smoking status: Never Smoker  . Smokeless tobacco: Never Used  Substance and Sexual Activity  . Alcohol use: Yes    Comment: occassional  . Drug use: No  . Sexual activity: Not on file  Lifestyle  . Physical activity:    Days per week: Not on file    Minutes per session: Not on file  . Stress: Not on file  Relationships  . Social connections:    Talks on phone: Not on file    Gets together: Not on file    Attends religious service: Not on file    Active member of club or organization: Not on file    Attends meetings of clubs or organizations: Not on file    Relationship status: Not on file  . Intimate partner violence:    Fear of current or ex partner: Not on file    Emotionally abused: Not on file    Physically abused: Not on file    Forced sexual activity: Not on file  Other Topics Concern  . Not on file  Social History Narrative  . Not on file    Past Surgical History:  Procedure Laterality Date  . ANTERIOR CERVICAL DECOMP/DISCECTOMY FUSION N/A 02/07/2015   Procedure: ANTERIOR CERVICAL DISCECTOMY FUSION C5-C7    ( 2 LEVELS);  Surgeon: Venita Lick, MD;  Location: [facility] OR;  Service: Orthopedics;  Laterality: N/A;  . COLONOSCOPY WITH PROPOFOL N/A 12/19/2013   Procedure: COLONOSCOPY WITH PROPOFOL;  Surgeon: Charolett Bumpers, MD;  Location: [facility]COPY;  Service: Endoscopy;  Laterality: N/A;  . right ankle surgery      Family History  Problem Relation Age of Onset  . Cancer Mother     No Known Allergies  Current Outpatient Medications on File Prior to Visit  Medication Sig Dispense Refill  . ALPRAZolam (XANAX) 0.5 MG tablet Take 0.5 mg by mouth 2 (two) times daily as needed for anxiety.    . Cholecalciferol (VITAMIN D3 PO) Take by mouth.    . DULoxetine (CYMBALTA)  30 MG capsule Take 30 mg by mouth daily.    Marland Kitchen L-Methylfolate-Algae (DEPLIN 7.5 PO) Take by mouth.    . OLANZapine-FLUoxetine (SYMBYAX) 6-25 MG capsule Take 1 capsule by mouth at bedtime.  4   No current facility-administered medications on file prior to visit.     BP (!) 146/80   Pulse (!) 106   Temp 98.1 F (36.7 C) (Oral)   Ht 4' 10.5" (1.486 m)   Wt 165 lb (74.8 kg)   LMP 01/21/2015 (Approximate)   SpO2 96%   BMI 33.90 kg/m    Objective:   Physical Exam  Constitutional: She is oriented to person, place, and time. She appears well-nourished.  Neck: Neck supple.  Cardiovascular: Normal rate and regular rhythm.  Pulmonary/Chest: Effort normal and breath  sounds normal.  Abdominal: Soft. Bowel sounds are normal. There is no tenderness.  Neurological: She is alert and oriented to person, place, and time.  Skin: Skin is warm and dry.  Psychiatric: She has a normal mood and affect.          Assessment & Plan:

## 2017-08-24 NOTE — Assessment & Plan Note (Signed)
Following with psychiatry, feels well managed. °Continue current regimen. °

## 2017-08-24 NOTE — Assessment & Plan Note (Signed)
Chronic for years. Failed treatment on probiotics, changes in diet, and antidiarrheals. Would like to get her off of Imodium if possible. Could not find colonoscopy in records.   Printed out FODMAP diet plan, discussed to keep a food journal. Rx for dicyclomine to use PRN, she will update in a few weeks. Consider GI evaluation if no improvement.

## 2017-09-07 ENCOUNTER — Ambulatory Visit: Payer: BLUE CROSS/BLUE SHIELD | Admitting: Primary Care

## 2017-09-07 ENCOUNTER — Encounter: Payer: Self-pay | Admitting: Primary Care

## 2017-09-07 DIAGNOSIS — I10 Essential (primary) hypertension: Secondary | ICD-10-CM | POA: Diagnosis not present

## 2017-09-07 LAB — BASIC METABOLIC PANEL
BUN: 13 mg/dL (ref 6–23)
CO2: 30 mEq/L (ref 19–32)
Calcium: 10.3 mg/dL (ref 8.4–10.5)
Chloride: 100 mEq/L (ref 96–112)
Creatinine, Ser: 0.58 mg/dL (ref 0.40–1.20)
GFR: 114.53 mL/min (ref >60.00)
Glucose, Bld: 105 mg/dL — ABNORMAL HIGH (ref 70–99)
POTASSIUM: 3.7 meq/L (ref 3.5–5.1)
SODIUM: 139 meq/L (ref 135–145)

## 2017-09-07 MED ORDER — METOPROLOL SUCCINATE ER 25 MG PO TB24: ORAL_TABLET | ORAL | 0 refills | Status: DC

## 2017-09-07 MED ORDER — HYDROCHLOROTHIAZIDE 25 MG PO TABS: ORAL_TABLET | ORAL | 2 refills | Status: AC

## 2017-09-07 NOTE — Progress Notes (Signed)
Subjective:    Patient ID: Ellen Acosta, female    DOB: 1960/09/23, 57 y.o.   MRN: [id number]  HPI  Ellen Acosta is a 57 year old female who presents today who presents today for follow up of hypertension.  She was last evaluated on 08/24/17 as a new patient and noted to have elevated blood pressure readings. She was once on treatment but stopped taking medications two years ago. During her last visit she was re-initiated on HCTZ 25 mg.   BP Readings from Last 3 Encounters:  09/07/17 140/80  08/24/17 (!) 146/80  02/08/15 (!) 154/80   Since her last visit she did check her BP twice with her brother's cuff which is running 140/80's on average. She denies dizziness, chest pain, shortness of breath. She endorses a chronic history of tachycardia, not sure why but has always had a fast heart rate.   Review of Systems  Eyes: Negative for visual disturbance.  Respiratory: Negative for shortness of breath.   Cardiovascular: Negative for chest pain.  Neurological: Negative for dizziness and headaches.        Past Medical History:  Diagnosis Date  . Chronic neck pain    cervical spondyliothesis, disc disease, radiculopathy  . Depression   . Headache(784.0)   . Hyperlipemia   . Hypertension   . IBS (irritable bowel syndrome)      Social History   Socioeconomic History  . Marital status: Married    Spouse name: Not on file  . Number of children: Not on file  . Years of education: Not on file  . Highest education level: Not on file  Occupational History  . Not on file  Social Needs  . Financial resource strain: Not on file  . Food insecurity:    Worry: Not on file    Inability: Not on file  . Transportation needs:    Medical: Not on file    Non-medical: Not on file  Tobacco Use  . Smoking status: Never Smoker  . Smokeless tobacco: Never Used  Substance and Sexual Activity  . Alcohol use: Yes    Comment: occassional  . Drug use: No  . Sexual activity: Not on file    Lifestyle  . Physical activity:    Days per week: Not on file    Minutes per session: Not on file  . Stress: Not on file  Relationships  . Social connections:    Talks on phone: Not on file    Gets together: Not on file    Attends religious service: Not on file    Active member of club or organization: Not on file    Attends meetings of clubs or organizations: Not on file    Relationship status: Not on file  . Intimate partner violence:    Fear of current or ex partner: Not on file    Emotionally abused: Not on file    Physically abused: Not on file    Forced sexual activity: Not on file  Other Topics Concern  . Not on file  Social History Narrative   Divorced.   Unemployed.   Enjoys walking her dogs.     Past Surgical History:  Procedure Laterality Date  . ANTERIOR CERVICAL DECOMP/DISCECTOMY FUSION N/A 02/07/2015   Procedure: ANTERIOR CERVICAL DISCECTOMY FUSION C5-C7    ( 2 LEVELS);  Surgeon: Venita Lick, MD;  Location: [facility] OR;  Service: Orthopedics;  Laterality: N/A;  . COLONOSCOPY WITH PROPOFOL N/A 12/19/2013   Procedure: COLONOSCOPY WITH  PROPOFOL;  Surgeon: Charolett Bumpers, MD;  Location: [facility];  Service: Endoscopy;  Laterality: N/A;  . right ankle surgery      Family History  Problem Relation Age of Onset  . Cancer Mother   . Heart disease Father   . Hyperlipidemia Father   . Hypertension Father   . Depression Maternal Grandmother   . Diabetes Maternal Grandmother   . Diabetes Paternal Grandmother     No Known Allergies  Current Outpatient Medications on File Prior to Visit  Medication Sig Dispense Refill  . ALPRAZolam (XANAX) 0.5 MG tablet Take 0.5 mg by mouth 2 (two) times daily as needed for anxiety.    . Cholecalciferol (VITAMIN D3 PO) Take by mouth.    . dicyclomine (BENTYL) 10 MG capsule Take 1 capsule by mouth three times daily before meals as needed for IBS symptoms. 60 capsule 0  . DULoxetine (CYMBALTA) 30 MG capsule Take 30 mg by mouth  daily.    Marland Kitchen L-Methylfolate-Algae (DEPLIN 7.5 PO) Take by mouth.    . OLANZapine-FLUoxetine (SYMBYAX) 6-25 MG capsule Take 1 capsule by mouth at bedtime.  4   No current facility-administered medications on file prior to visit.     BP 140/80   Pulse (!) 108   Temp 98 F (36.7 C) (Oral)   Ht 4' 10.5" (1.486 m)   Wt 165 lb 8 oz (75.1 kg)   LMP 01/21/2015 (Approximate)   SpO2 98%   BMI 34.00 kg/m    Objective:   Physical Exam  Constitutional: She appears well-nourished.  Cardiovascular: Regular rhythm.  Sinus tachycardia  Pulmonary/Chest: Effort normal and breath sounds normal.  Skin: Skin is warm and dry.          Assessment & Plan:

## 2017-09-07 NOTE — Patient Instructions (Addendum)
Continue hydrochlorothiazide 25 mg tablets for high blood pressure.  Start metoprolol succinate 25 mg tablets for high blood pressure.  Stop by the lab prior to leaving today. I will notify you of your results once received.   Schedule a follow up visit in 2-3 weeks for blood pressure check.  It was a pleasure to see you today!   DASH Eating Plan DASH stands for "Dietary Approaches to Stop Hypertension." The DASH eating plan is a healthy eating plan that has been shown to reduce high blood pressure (hypertension). It may also reduce your risk for type 2 diabetes, heart disease, and stroke. The DASH eating plan may also help with weight loss. What are tips for following this plan? General guidelines  Avoid eating more than 2,300 mg (milligrams) of salt (sodium) a day. If you have hypertension, you may need to reduce your sodium intake to 1,500 mg a day.  Limit alcohol intake to no more than 1 drink a day for nonpregnant women and 2 drinks a day for men. One drink equals 12 oz of beer, 5 oz of wine, or 1 oz of hard liquor.  Work with your health care provider to maintain a healthy body weight or to lose weight. Ask what an ideal weight is for you.  Get at least 30 minutes of exercise that causes your heart to beat faster (aerobic exercise) most days of the week. Activities may include walking, swimming, or biking.  Work with your health care provider or diet and nutrition specialist (dietitian) to adjust your eating plan to your individual calorie needs. Reading food labels  Check food labels for the amount of sodium per serving. Choose foods with less than 5 percent of the Daily Value of sodium. Generally, foods with less than 300 mg of sodium per serving fit into this eating plan.  To find whole grains, look for the word "whole" as the first word in the ingredient list. Shopping  Buy products labeled as "low-sodium" or "no salt added."  Buy fresh foods. Avoid canned foods and  premade or frozen meals. Cooking  Avoid adding salt when cooking. Use salt-free seasonings or herbs instead of table salt or sea salt. Check with your health care provider or pharmacist before using salt substitutes.  Do not fry foods. Cook foods using healthy methods such as baking, boiling, grilling, and broiling instead.  Cook with heart-healthy oils, such as olive, canola, soybean, or sunflower oil. Meal planning   Eat a balanced diet that includes: ? 5 or more servings of fruits and vegetables each day. At each meal, try to fill half of your plate with fruits and vegetables. ? Up to 6-8 servings of whole grains each day. ? Less than 6 oz of lean meat, poultry, or fish each day. A 3-oz serving of meat is about the same size as a deck of cards. One egg equals 1 oz. ? 2 servings of low-fat dairy each day. ? A serving of nuts, seeds, or beans 5 times each week. ? Heart-healthy fats. Healthy fats called Omega-3 fatty acids are found in foods such as flaxseeds and coldwater fish, like sardines, salmon, and mackerel.  Limit how much you eat of the following: ? Canned or prepackaged foods. ? Food that is high in trans fat, such as fried foods. ? Food that is high in saturated fat, such as fatty meat. ? Sweets, desserts, sugary drinks, and other foods with added sugar. ? Full-fat dairy products.  Do not salt foods before  eating.  Try to eat at least 2 vegetarian meals each week.  Eat more home-cooked food and less restaurant, buffet, and fast food.  When eating at a restaurant, ask that your food be prepared with less salt or no salt, if possible. What foods are recommended? The items listed may not be a complete list. Talk with your dietitian about what dietary choices are best for you. Grains Whole-grain or whole-wheat bread. Whole-grain or whole-wheat pasta. Brown rice. Modena Morrow. Bulgur. Whole-grain and low-sodium cereals. Pita bread. Low-fat, low-sodium crackers.  Whole-wheat flour tortillas. Vegetables Fresh or frozen vegetables (raw, steamed, roasted, or grilled). Low-sodium or reduced-sodium tomato and vegetable juice. Low-sodium or reduced-sodium tomato sauce and tomato paste. Low-sodium or reduced-sodium canned vegetables. Fruits All fresh, dried, or frozen fruit. Canned fruit in natural juice (without added sugar). Meat and other protein foods Skinless chicken or [place]. Ground chicken or [place]. Pork with fat trimmed off. Fish and seafood. Egg whites. Dried beans, peas, or lentils. Unsalted nuts, nut butters, and seeds. Unsalted canned beans. Lean cuts of beef with fat trimmed off. Low-sodium, lean deli meat. Dairy Low-fat (1%) or fat-free (skim) milk. Fat-free, low-fat, or reduced-fat cheeses. Nonfat, low-sodium ricotta or cottage cheese. Low-fat or nonfat yogurt. Low-fat, low-sodium cheese. Fats and oils Soft margarine without trans fats. Vegetable oil. Low-fat, reduced-fat, or light mayonnaise and salad dressings (reduced-sodium). Canola, safflower, olive, soybean, and sunflower oils. Avocado. Seasoning and other foods Herbs. Spices. Seasoning mixes without salt. Unsalted popcorn and pretzels. Fat-free sweets. What foods are not recommended? The items listed may not be a complete list. Talk with your dietitian about what dietary choices are best for you. Grains Baked goods made with fat, such as croissants, muffins, or some breads. Dry pasta or rice meal packs. Vegetables Creamed or fried vegetables. Vegetables in a cheese sauce. Regular canned vegetables (not low-sodium or reduced-sodium). Regular canned tomato sauce and paste (not low-sodium or reduced-sodium). Regular tomato and vegetable juice (not low-sodium or reduced-sodium). Angie Fava. Olives. Fruits Canned fruit in a light or heavy syrup. Fried fruit. Fruit in cream or butter sauce. Meat and other protein foods Fatty cuts of meat. Ribs. Fried meat. Berniece Salines. Sausage. Bologna and other  processed lunch meats. Salami. Fatback. Hotdogs. Bratwurst. Salted nuts and seeds. Canned beans with added salt. Canned or smoked fish. Whole eggs or egg yolks. Chicken or [place] with skin. Dairy Whole or 2% milk, cream, and half-and-half. Whole or full-fat cream cheese. Whole-fat or sweetened yogurt. Full-fat cheese. Nondairy creamers. Whipped toppings. Processed cheese and cheese spreads. Fats and oils Butter. Stick margarine. Lard. Shortening. Ghee. Bacon fat. Tropical oils, such as coconut, palm kernel, or palm oil. Seasoning and other foods Salted popcorn and pretzels. Onion salt, garlic salt, seasoned salt, table salt, and sea salt. Worcestershire sauce. Tartar sauce. Barbecue sauce. Teriyaki sauce. Soy sauce, including reduced-sodium. Steak sauce. Canned and packaged gravies. Fish sauce. Oyster sauce. Cocktail sauce. Horseradish that you find on the shelf. Ketchup. Mustard. Meat flavorings and tenderizers. Bouillon cubes. Hot sauce and Tabasco sauce. Premade or packaged marinades. Premade or packaged taco seasonings. Relishes. Regular salad dressings. Where to find more information:  National Heart, Lung, and [place]: https://wilson-eaton.com/  American Heart Association: www.heart.org Summary  The DASH eating plan is a healthy eating plan that has been shown to reduce high blood pressure (hypertension). It may also reduce your risk for type 2 diabetes, heart disease, and stroke.  With the DASH eating plan, you should limit salt (sodium) intake to 2,300 mg a  day. If you have hypertension, you may need to reduce your sodium intake to 1,500 mg a day.  When on the DASH eating plan, aim to eat more fresh fruits and vegetables, whole grains, lean proteins, low-fat dairy, and heart-healthy fats.  Work with your health care provider or diet and nutrition specialist (dietitian) to adjust your eating plan to your individual calorie needs. This information is not intended to replace advice given to  you by your health care provider. Make sure you discuss any questions you have with your health care provider. Document Released: 04/16/2011 Document Revised: 04/20/2016 Document Reviewed: 04/20/2016 Elsevier Interactive Patient Education  [employer].

## 2017-09-07 NOTE — Assessment & Plan Note (Signed)
Improved but not yet at goal, also with tachycardia on exam. Continue HCTZ 25 mg, add in Metoprolol succinate 25 mg. BMP pending today.  Follow up in 2-3 weeks for BP check.

## 2017-09-15 ENCOUNTER — Other Ambulatory Visit: Payer: Self-pay | Admitting: Primary Care

## 2017-09-15 DIAGNOSIS — I10 Essential (primary) hypertension: Secondary | ICD-10-CM

## 2017-09-27 ENCOUNTER — Ambulatory Visit: Payer: BLUE CROSS/BLUE SHIELD | Admitting: Primary Care

## 2017-09-27 DIAGNOSIS — Z0289 Encounter for other administrative examinations: Secondary | ICD-10-CM

## 2017-10-09 ENCOUNTER — Other Ambulatory Visit: Payer: Self-pay | Admitting: Primary Care

## 2017-10-09 DIAGNOSIS — K580 Irritable bowel syndrome with diarrhea: Secondary | ICD-10-CM

## 2017-10-11 NOTE — Telephone Encounter (Signed)
Ok to refill? Electronically refill request for dicyclomine (BENTYL) 10 MG capsule  Last prescribed and seen on 08/24/2017

## 2017-10-11 NOTE — Telephone Encounter (Signed)
How's she doing since we initiated the dicyclomine for her IBS symptoms?  Any improvement in diarrhea?

## 2017-10-12 NOTE — Telephone Encounter (Signed)
Spoken and notified patient of Graylon GunningKate Clark's comments. Patient stated that she does not need a refill. She still have plenty and using as needed. The dicyclomine does help with the IBS symptoms and yes, there is improvement in the diarrhea.

## 2017-10-13 NOTE — Telephone Encounter (Signed)
Noted  

## 2018-04-27 ENCOUNTER — Ambulatory Visit: Payer: BLUE CROSS/BLUE SHIELD | Admitting: Primary Care

## 2018-04-27 ENCOUNTER — Encounter: Payer: Self-pay | Admitting: Primary Care

## 2018-04-27 VITALS — BP 134/90 | HR 117 | Temp 98.0°F | Ht 58.5 in | Wt 165.5 lb

## 2018-04-27 DIAGNOSIS — J209 Acute bronchitis, unspecified: Secondary | ICD-10-CM

## 2018-04-27 DIAGNOSIS — I10 Essential (primary) hypertension: Secondary | ICD-10-CM | POA: Diagnosis not present

## 2018-04-27 MED ORDER — METOPROLOL SUCCINATE ER 25 MG PO TB24: ORAL_TABLET | ORAL | 0 refills | Status: DC

## 2018-04-27 MED ORDER — AZITHROMYCIN 250 MG PO TABS: ORAL_TABLET | ORAL | 0 refills | Status: DC

## 2018-04-27 NOTE — Patient Instructions (Signed)
Resume your metoprolol succinate blood pressure/heart medication.  Resume your hydrochlorothiazide blood pressure medication.  Start Azithromycin antibiotics for infection. Take 2 tablets by mouth today, then 1 tablet daily for 4 additional days.  You can try Delsym or Robitussin as needed for cough.  Make sure to stay hydrated with fluids and rest.  It was a pleasure to see you today!

## 2018-04-27 NOTE — Progress Notes (Signed)
Subjective:    Patient ID: Ellen Acosta, female    DOB: [birth year], 57 y.o.   MRN: [id number]  HPI  Ellen Acosta is a 57 year old female who presents today with a chief complaint of cough.  She also reports chest congestion, shortness of breath, fatigue. Her symptoms began two weeks ago, feeling about the same today. She denies sinus pressure, productive cough, fevers. She's taken cough drops without much improvement.  Of note, she stopped taking her metoprolol succinate and HCTZ in August 2019 as she never requested refills. She was going through a divorce at that time and didn't feel the need to refill. She will be switching to another PCP in early 2020 due to insurance changes.   Review of Systems  Constitutional: Positive for chills and fatigue. Negative for fever.  HENT: Positive for congestion and postnasal drip. Negative for sinus pressure and sore throat.   Respiratory: Positive for cough and shortness of breath.   Cardiovascular: Negative for chest pain.       Past Medical History:  Diagnosis Date  . Chronic neck pain    cervical spondyliothesis, disc disease, radiculopathy  . Depression   . Headache(784.0)   . Hyperlipemia   . Hypertension   . IBS (irritable bowel syndrome)      Social History   Socioeconomic History  . Marital status: Married    Spouse name: Not on file  . Number of children: Not on file  . Years of education: Not on file  . Highest education level: Not on file  Occupational History  . Not on file  Social Needs  . Financial resource strain: Not on file  . Food insecurity:    Worry: Not on file    Inability: Not on file  . Transportation needs:    Medical: Not on file    Non-medical: Not on file  Tobacco Use  . Smoking status: Never Smoker  . Smokeless tobacco: Never Used  Substance and Sexual Activity  . Alcohol use: Yes    Comment: occassional  . Drug use: No  . Sexual activity: Not on file  Lifestyle  . Physical activity:      Days per week: Not on file    Minutes per session: Not on file  . Stress: Not on file  Relationships  . Social connections:    Talks on phone: Not on file    Gets together: Not on file    Attends religious service: Not on file    Active member of club or organization: Not on file    Attends meetings of clubs or organizations: Not on file    Relationship status: Not on file  . Intimate partner violence:    Fear of current or ex partner: Not on file    Emotionally abused: Not on file    Physically abused: Not on file    Forced sexual activity: Not on file  Other Topics Concern  . Not on file  Social History Narrative   Divorced.   Unemployed.   Enjoys walking her dogs.     Past Surgical History:  Procedure Laterality Date  . ANTERIOR CERVICAL DECOMP/DISCECTOMY FUSION N/A 02/07/2015   Procedure: ANTERIOR CERVICAL DISCECTOMY FUSION C5-C7    ( 2 LEVELS);  Surgeon: Venita Lick, MD;  Location: [facility] OR;  Service: Orthopedics;  Laterality: N/A;  . COLONOSCOPY WITH PROPOFOL N/A 12/19/2013   Procedure: COLONOSCOPY WITH PROPOFOL;  Surgeon: Charolett Bumpers, MD;  Location: [facility];  Service:  Endoscopy;  Laterality: N/A;  . right ankle surgery      Family History  Problem Relation Age of Onset  . Cancer Mother   . Heart disease Father   . Hyperlipidemia Father   . Hypertension Father   . Depression Maternal Grandmother   . Diabetes Maternal Grandmother   . Diabetes Paternal Grandmother     No Known Allergies  Current Outpatient Medications on File Prior to Visit  Medication Sig Dispense Refill  . ALPRAZolam (XANAX) 0.5 MG tablet Take 0.5 mg by mouth 2 (two) times daily as needed for anxiety.    . Cholecalciferol (VITAMIN D3 PO) Take by mouth.    . dicyclomine (BENTYL) 10 MG capsule Take 1 capsule by mouth three times daily before meals as needed for IBS symptoms. 60 capsule 0  . DULoxetine (CYMBALTA) 30 MG capsule Take 30 mg by mouth daily.    Marland Kitchen. L-Methylfolate-Algae  (DEPLIN 7.5 PO) Take by mouth.    . OLANZapine-FLUoxetine (SYMBYAX) 6-25 MG capsule Take 1 capsule by mouth at bedtime.  4  . hydrochlorothiazide (HYDRODIURIL) 25 MG tablet Take 1 tablet by mouth once daily for high blood pressure. (Patient not taking: Reported on 04/27/2018) 90 tablet 2  . metoprolol succinate (TOPROL-XL) 25 MG 24 hr tablet TAKE 1 TABLET BY MOUTH EVERY MORNING FOR HIGH BLOOD PRESSURE. (Patient not taking: Reported on 04/27/2018) 90 tablet 0   No current facility-administered medications on file prior to visit.     BP 134/90   Pulse (!) 117   Temp 98 F (36.7 C) (Oral)   Ht 4' 10.5" (1.486 m)   Wt 165 lb 8 oz (75.1 kg)   LMP 01/21/2015 (Approximate)   SpO2 98%   BMI 34.00 kg/m    Objective:   Physical Exam  Constitutional: She appears well-nourished. She does not appear ill.  HENT:  Right Ear: Tympanic membrane and ear canal normal.  Left Ear: Tympanic membrane and ear canal normal.  Nose: No mucosal edema. Right sinus exhibits no maxillary sinus tenderness and no frontal sinus tenderness. Left sinus exhibits no maxillary sinus tenderness and no frontal sinus tenderness.  Mouth/Throat: Oropharynx is clear and moist.  Neck: Neck supple.  Cardiovascular: Regular rhythm.  Sinus tachycardia  Respiratory: Effort normal and breath sounds normal. She has no wheezes.  Course breath sounds to right upper and lower fields.  Skin: Skin is warm and dry.           Assessment & Plan:  Acute Bronchitis:  Cough, congestion x 2 weeks, no improvement. Exam today suspicious for bronchitis, no obvious pneumonia. Given duration of symptoms coupled with exam and tachycardia, will treat. Rx for Zpak sent to pharmacy. Discussed use of Delsym or Robitussin PRN. Fluids, rest, follow up PRN.  Doreene NestKatherine K Samanyu Tinnell, NP

## 2021-09-06 ENCOUNTER — Ambulatory Visit
Admission: RE | Admit: 2021-09-06 | Discharge: 2021-09-06 | Disposition: A | Discharge status: Home / Self Care | Payer: BLUE CROSS/BLUE SHIELD | Source: Ambulatory Visit | Attending: Internal Medicine | Admitting: Internal Medicine

## 2021-09-06 VITALS — BP 149/73 | HR 100 | Temp 98.2°F | Resp 18

## 2021-09-06 DIAGNOSIS — B309 Viral conjunctivitis, unspecified: Secondary | ICD-10-CM

## 2021-09-06 DIAGNOSIS — Z20822 Contact with and (suspected) exposure to covid-19: Secondary | ICD-10-CM

## 2021-09-06 MED ORDER — AZELASTINE HCL 0.05 % OP SOLN: 1.0000 [drp] | Freq: Two times a day (BID) | OPHTHALMIC | 0 refills | Status: DC

## 2021-09-06 NOTE — ED Triage Notes (Signed)
Pt presents with right eye redness and drainage started today. 

## 2021-09-06 NOTE — Discharge Instructions (Addendum)
Use warm compresses to your right eye several times a day with clean washcloth each time.  Clean your contacts very carefully before resuming wearing them after your infection is healed.  ? ?You will get a call if test is positive, you will not get a call if test is negative but you can check results in MyChart if you have a MyChart account.   ?

## 2021-09-07 ENCOUNTER — Inpatient Hospital Stay: Admission: RE | Admit: 2021-09-07 | Payer: Self-pay | Source: Ambulatory Visit

## 2021-09-07 LAB — NOVEL CORONAVIRUS, NAA: SARS-CoV-2, NAA: NOT DETECTED

## 2021-09-08 NOTE — ED Provider Notes (Signed)
?[facility] ? ? ? ?CSN: [id number] ?Arrival date & time: 09/06/21  1407 ? ? ?  ? ?History   ?Chief Complaint ?Chief Complaint  ?Patient presents with  ? Appointment- Eye Problem   ? ? ?HPI ?Ellen Acosta is a 61 y.o. female. She reports several days of nasal congestion and rhinorrhea, assumed it was allergies. Wears contacts, took them out last night, felt fine at the time. Woke up this morning with R eye redness, watery drainage. Feels like something could be in her eye but denies specific known injury. Has not worn contact since yesterday. L eye feels fine.  ? ?HPI ? ?Past Medical History:  ?Diagnosis Date  ? Chronic neck pain   ? cervical spondyliothesis, disc disease, radiculopathy  ? Depression   ? Headache(784.0)   ? Hyperlipemia   ? Hypertension   ? IBS (irritable bowel syndrome)   ? ? ?Patient Active Problem List  ? Diagnosis Date Noted  ? Irritable bowel syndrome with diarrhea 08/24/2017  ? Essential hypertension 08/24/2017  ? Prediabetes 08/24/2017  ? Major depressive disorder 08/24/2017  ? Hyperlipidemia 08/24/2017  ? Vitamin B 12 deficiency 08/24/2017  ? ? ?Past Surgical History:  ?Procedure Laterality Date  ? ANTERIOR CERVICAL DECOMP/DISCECTOMY FUSION N/A 02/07/2015  ? Procedure: ANTERIOR CERVICAL DISCECTOMY FUSION C5-C7    ( 2 LEVELS);  Surgeon: Melina Schools, MD;  Location: [place];  Service: Orthopedics;  Laterality: N/A;  ? COLONOSCOPY WITH PROPOFOL N/A 12/19/2013  ? Procedure: COLONOSCOPY WITH PROPOFOL;  Surgeon: Garlan Fair, MD;  Location: [facility];  Service: Endoscopy;  Laterality: N/A;  ? right ankle surgery    ? ? ?OB History   ?No obstetric history on file. ?  ? ? ? ?Home Medications   ? ?Prior to Admission medications   ?Medication Sig Start Date End Date Taking? Authorizing Provider  ?amphetamine-dextroamphetamine (ADDERALL XR) 10 MG 24 hr capsule Take by mouth.   Yes [provider]  ?atorvastatin (LIPITOR) 20 MG tablet Take 20 mg by mouth at bedtime. 07/02/21  Yes  [provider]  ?azelastine (OPTIVAR) 0.05 % ophthalmic solution Place 1 drop into the right eye 2 (two) times daily. 09/06/21  Yes Carvel Getting, NP  ?DULoxetine (CYMBALTA) 30 MG capsule Take 30 mg by mouth daily.   Yes [provider]  ?FLUoxetine (PROZAC) 20 MG capsule Take 20 mg by mouth daily. 08/06/21  Yes [provider]  ?hydrochlorothiazide (HYDRODIURIL) 25 MG tablet Take 1 tablet by mouth once daily for high blood pressure. 09/07/17  Yes Pleas Koch, NP  ?metoprolol succinate (TOPROL-XL) 50 MG 24 hr tablet Take 1 tablet by mouth daily. 09/01/21  Yes [provider]  ?ALPRAZolam Duanne Moron) 0.5 MG tablet Take 0.5 mg by mouth 2 (two) times daily as needed for anxiety.    [provider]  ?azithromycin (ZITHROMAX) 250 MG tablet Take 2 tablets by mouth today, then 1 tablet daily for 4 additional days. 04/27/18   Pleas Koch, NP  ?Cholecalciferol (VITAMIN D3 PO) Take by mouth.    [provider]  ?dicyclomine (BENTYL) 10 MG capsule Take 1 capsule by mouth three times daily before meals as needed for IBS symptoms. 08/24/17   Pleas Koch, NP  ?L-Methylfolate-Algae (DEPLIN 7.5 PO) Take by mouth.    [provider]  ?metoprolol succinate (TOPROL-XL) 25 MG 24 hr tablet Take 1 tablet by mouth once daily for blood pressure and heart rate. 04/27/18   Pleas Koch, NP  ?  OLANZapine (ZYPREXA) 5 MG tablet Take 5 mg by mouth at bedtime. 06/09/21   [provider]  ?OLANZapine-FLUoxetine (SYMBYAX) 6-25 MG capsule Take 1 capsule by mouth at bedtime. 07/26/17   [provider]  ? ? ?Family History ?Family History  ?Problem Relation Age of Onset  ? Cancer Mother   ? Heart disease Father   ? Hyperlipidemia Father   ? Hypertension Father   ? Depression Maternal Grandmother   ? Diabetes Maternal Grandmother   ? Diabetes Paternal Grandmother   ? ? ?Social History ?Social History  ? ?Tobacco Use  ? Smoking status: Never  ? Smokeless  tobacco: Never  ?Vaping Use  ? Vaping Use: Never used  ?Substance Use Topics  ? Alcohol use: Yes  ?  Comment: occassional  ? Drug use: No  ? ? ? ?Allergies   ?Patient has no known allergies. ? ? ?Review of Systems ?Review of Systems  ?HENT:  Positive for congestion and rhinorrhea.   ?Eyes:  Positive for discharge and redness. Negative for photophobia, pain, itching and visual disturbance.  ? ? ?Physical Exam ?Triage Vital Signs ?ED Triage Vitals  ?Enc Vitals Group  ?   BP 09/06/21 1427 (!) 149/73  ?   Pulse Rate 09/06/21 1427 100  ?   Resp 09/06/21 1427 18  ?   Temp 09/06/21 1427 98.2 ?F (36.8 ?C)  ?   Temp Source 09/06/21 1427 Oral  ?   SpO2 09/06/21 1427 96 %  ?   Weight --   ?   Height --   ?   Head Circumference --   ?   Peak Flow --   ?   Pain Score 09/06/21 1433 0  ?   Pain Loc --   ?   Pain Edu? --   ?   Excl. in [place]? --   ? ?No data found. ? ?Updated Vital Signs ?BP (!) 149/73 (BP Location: Left Arm)   Pulse 100   Temp 98.2 ?F (36.8 ?C) (Oral)   Resp 18   LMP 01/21/2015 (Approximate)   SpO2 96%  ? ?Visual Acuity ?Right Eye Distance:   ?Left Eye Distance:   ?Bilateral Distance:   ? ?Right Eye Near:   ?Left Eye Near:    ?Bilateral Near:    ? ?Physical Exam ?Constitutional:   ?   General: She is not in acute distress. ?   Appearance: Normal appearance. She is not ill-appearing.  ?Eyes:  ?   General: Lids are normal. Lids are everted, no foreign bodies appreciated. Vision grossly intact.     ?   Right eye: Discharge present. No foreign body or hordeolum.     ?   Left eye: No discharge or hordeolum.  ?   Conjunctiva/sclera:  ?   Right eye: Right conjunctiva is injected. No chemosis or exudate. ?   Left eye: Left conjunctiva is not injected. No chemosis or exudate. ?   Pupils: Pupils are equal, round, and reactive to light.  ?   Right eye: No corneal abrasion or fluorescein uptake.  ?   Comments: Instilled 2 drops tetracaine to R eye prior to using fluorescein. Watery mucoid discharge from R eye   ?Neurological:  ?   Mental Status: She is alert.  ? ? ? ?UC Treatments / Results  ?Labs ?(all labs ordered are listed, but only abnormal results are displayed) ?Labs Reviewed  ?NOVEL CORONAVIRUS, NAA  ? Narrative:   ? Performed at:  [facility] ?  [address], [place], [place]  [id number] ?Lab Director: Rush Farmer MD, Phone:  [phone]  ? ? ?EKG ? ? ?Radiology ?No results found. ? ?Procedures ?Procedures (including critical care time) ? ?Medications Ordered in UC ?Medications - No data to display ? ?Initial Impression / Assessment and Plan / UC Course  ?I have reviewed the triage vital signs and the nursing notes. ? ?Pertinent labs & imaging results that were available during my care of the patient were reviewed by me and considered in my medical decision making (see chart for details). ? ?  ?COVID test results pending. Likely viral conjunctivitis. Rx azelastine and given supportive care instructions.  ? ?Final Clinical Impressions(s) / UC Diagnoses  ? ?Final diagnoses:  ?Viral conjunctivitis  ? ? ? ?Discharge Instructions   ? ?  ?Use warm compresses to your right eye several times a day with clean washcloth each time.  Clean your contacts very carefully before resuming wearing them after your infection is healed.  ? ?You will get a call if test is positive, you will not get a call if test is negative but you can check results in MyChart if you have a MyChart account.   ? ? ?ED Prescriptions   ? ? Medication Sig Dispense Auth. Provider  ? azelastine (OPTIVAR) 0.05 % ophthalmic solution Place 1 drop into the right eye 2 (two) times daily. 6 mL Carvel Getting, NP  ? ?  ? ?PDMP not reviewed this encounter. ?  ?Carvel Getting, NP ?09/08/21 1348 ? ?

## 2023-02-13 LAB — OPHTHALMOLOGY REPORT-SCANNED

## 2023-04-12 ENCOUNTER — Ambulatory Visit
Admission: RE | Admit: 2023-04-12 | Discharge: 2023-04-12 | Disposition: A | Discharge status: Home / Self Care | Payer: BLUE CROSS/BLUE SHIELD | Source: Ambulatory Visit

## 2023-04-12 VITALS — BP 129/79 | HR 78 | Temp 98.6°F | Resp 18 | Ht 59.0 in | Wt 158.0 lb

## 2023-04-12 DIAGNOSIS — M542 Cervicalgia: Secondary | ICD-10-CM | POA: Diagnosis not present

## 2023-04-12 DIAGNOSIS — M79662 Pain in left lower leg: Secondary | ICD-10-CM

## 2023-04-12 MED ORDER — PREDNISONE 10 MG (21) PO TBPK: ORAL_TABLET | Freq: Every day | ORAL | 0 refills | Status: DC

## 2023-04-12 MED ORDER — KETOROLAC TROMETHAMINE 30 MG/ML IJ SOLN
30.0000 mg | Freq: Once | INTRAMUSCULAR | Status: AC
  Administered 2023-04-12: 30 mg via INTRAMUSCULAR

## 2023-04-12 MED ORDER — BACLOFEN 10 MG PO TABS: 10.0000 mg | ORAL_TABLET | Freq: Every evening | ORAL | 0 refills | Status: DC | PRN

## 2023-04-12 NOTE — ED Provider Notes (Signed)
Ellen Acosta    CSN: [id number] Arrival date & time: 04/12/23  1644      History   Chief Complaint Chief Complaint  Patient presents with   Knee Pain    Entered by patient    HPI Ellen Acosta is a 62 y.o. female.   Patient presents for evaluation of left calf pain beginning 1 day ago.  Endorses that she was sitting on a couch for approximately an hour and when she went to stand up a sharp shooting pain went down the left leg, initially thought to be the left knee but pain has become more prominent into the calf.  Symptoms exacerbated when the leg is straight and but improve with bending.  Able to bear weight.  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  Concerned with persisting right-sided neck pain radiating into the shoulder present intermittently for 2 to 3 months.  Endorses symptoms began after joining a workout class in which she was frequently working out the shoulder muscles.  History of a degenerative disc disease and cervical fusion which she feels symptoms feels very similar to.  Denies numbness or tingling.  Has been taking Aleve intermittently for both areas of pain which has been ineffective.  Past Medical History:  Diagnosis Date   Chronic neck pain    cervical spondyliothesis, disc disease, radiculopathy   Depression    Headache(784.0)    Hyperlipemia    Hypertension    IBS (irritable bowel syndrome)     Patient Active Problem List   Diagnosis Date Noted   Irritable bowel syndrome with diarrhea 08/24/2017   Essential hypertension 08/24/2017   Prediabetes 08/24/2017   Major depressive disorder 08/24/2017   Hyperlipidemia 08/24/2017   Vitamin B 12 deficiency 08/24/2017    Past Surgical History:  Procedure Laterality Date   ANTERIOR CERVICAL DECOMP/DISCECTOMY FUSION N/A 02/07/2015   Procedure: ANTERIOR CERVICAL DISCECTOMY FUSION C5-C7    ( 2 LEVELS);   Surgeon: Venita Lick, MD;  Location: [facility] OR;  Service: Orthopedics;  Laterality: N/A;   COLONOSCOPY WITH PROPOFOL N/A 12/19/2013   Procedure: COLONOSCOPY WITH PROPOFOL;  Surgeon: Charolett Bumpers, MD;  Location: [facility];  Service: Endoscopy;  Laterality: N/A;   right ankle surgery      OB History   No obstetric history on file.      Home Medications    Prior to Admission medications   Medication Sig Start Date End Date Taking? Authorizing Provider  baclofen (LIORESAL) 10 MG tablet Take 1 tablet (10 mg total) by mouth at bedtime as needed for muscle spasms. 04/12/23  Yes Kade Demicco R, NP  predniSONE (STERAPRED UNI-PAK 21 TAB) 10 MG (21) TBPK tablet Take by mouth daily. Take 6 tabs by mouth daily  for 1 days, then 5 tabs for 1 days, then 4 tabs for 1 days, then 3 tabs for 1 days, 2 tabs for 1 days, then 1 tab by mouth daily for 1 days 04/12/23  Yes Tao Satz R, NP  ALPRAZolam (XANAX) 0.5 MG tablet Take 0.5 mg by mouth 2 (two) times daily as needed for anxiety.    [provider]  amphetamine-dextroamphetamine (ADDERALL XR) 10 MG 24 hr capsule Take by mouth.    [provider]  atorvastatin (LIPITOR) 20 MG tablet Take 20 mg by mouth at bedtime. 07/02/21   [provider]  azelastine (OPTIVAR) 0.05 % ophthalmic solution Place  1 drop into the right eye 2 (two) times daily. 09/06/21   Cathlyn Parsons, NP  azithromycin (ZITHROMAX) 250 MG tablet Take 2 tablets by mouth today, then 1 tablet daily for 4 additional days. 04/27/18   Doreene Nest, NP  Cholecalciferol (VITAMIN D3 PO) Take by mouth.    [provider]  dicyclomine (BENTYL) 10 MG capsule Take 1 capsule by mouth three times daily before meals as needed for IBS symptoms. 08/24/17   Doreene Nest, NP  DULoxetine (CYMBALTA) 30 MG capsule Take 30 mg by mouth daily.    [provider]  FLUoxetine (PROZAC) 20 MG capsule Take 20 mg by mouth daily. 08/06/21   [provider]   hydrochlorothiazide (HYDRODIURIL) 25 MG tablet Take 1 tablet by mouth once daily for high blood pressure. 09/07/17   Doreene Nest, NP  L-Methylfolate-Algae (DEPLIN 7.5 PO) Take by mouth.    [provider]  metFORMIN (GLUCOPHAGE-XR) 500 MG 24 hr tablet Take 1,000 mg by mouth every morning.    [provider]  metoprolol succinate (TOPROL-XL) 25 MG 24 hr tablet Take 1 tablet by mouth once daily for blood pressure and heart rate. 04/27/18   Doreene Nest, NP  metoprolol succinate (TOPROL-XL) 50 MG 24 hr tablet Take 1 tablet by mouth daily. 09/01/21   [provider]  OLANZapine (ZYPREXA) 5 MG tablet Take 5 mg by mouth at bedtime. 06/09/21   [provider]  OLANZapine-FLUoxetine (SYMBYAX) 6-25 MG capsule Take 1 capsule by mouth at bedtime. 07/26/17   [provider]  rosuvastatin (CRESTOR) 20 MG tablet Take 20 mg by mouth daily.    [provider]    Family History Family History  Problem Relation Age of Onset   Cancer Mother    Heart disease Father    Hyperlipidemia Father    Hypertension Father    Depression Maternal Grandmother    Diabetes Maternal Grandmother    Diabetes Paternal Grandmother     Social History Social History   Tobacco Use   Smoking status: Never   Smokeless tobacco: Never  Vaping Use   Vaping status: Never Used  Substance Use Topics   Alcohol use: Not Currently    Comment: occassional   Drug use: No     Allergies   Patient has no known allergies.   Review of Systems Review of Systems   Physical Exam Triage Vital Signs ED Triage Vitals  Encounter Vitals Group     BP 04/12/23 1707 129/79     Systolic BP Percentile --      Diastolic BP Percentile --      Pulse Rate 04/12/23 1707 78     Resp 04/12/23 1707 18     Temp 04/12/23 1707 98.6 F (37 C)     Temp Source 04/12/23 1707 Oral     SpO2 04/12/23 1707 98 %     Weight 04/12/23 1702 158 lb (71.7 kg)     Height 04/12/23 1702 4\' 11"   (1.499 m)     Head Circumference --      Peak Flow --      Pain Score 04/12/23 1702 8     Pain Loc --      Pain Education --      Exclude from Growth Chart --    No data found.  Updated Vital Signs BP 129/79 (BP Location: Left Arm)   Pulse 78   Temp 98.6 F (37 C) (Oral)   Resp  18   Ht 4\' 11"  (1.499 m)   Wt 158 lb (71.7 kg)   LMP 01/21/2015   SpO2 98%   BMI 31.91 kg/m   Visual Acuity Right Eye Distance:   Left Eye Distance:   Bilateral Distance:    Right Eye Near:   Left Eye Near:    Bilateral Near:     Physical Exam Constitutional:      Appearance: Normal appearance.  Eyes:     Extraocular Movements: Extraocular movements intact.  Neck:     Comments: Tenderness are present along the right lateral aspect of the neck without ecchymosis swelling or deformity, able to complete range of motion of the neck pain elicited with right lateral turning, able to complete full range of motion of the right shoulder, negative Hawkins sign, 2+ carotid and brachial pulse Pulmonary:     Effort: Pulmonary effort is normal.  Musculoskeletal:       Legs:     Comments: Tenderness of present along the lateral aspect of the left calf, no ecchymosis swelling or deformity, 2+ popliteal pulse, able to bear weight    Neurological:     Mental Status: She is alert and oriented to person, place, and time.      UC Treatments / Results  Labs (all labs ordered are listed, but only abnormal results are displayed) Labs Reviewed - No data to display  EKG   Radiology No results found.  Procedures Procedures (including critical care time)  Medications Ordered in UC Medications  ketorolac (TORADOL) 30 MG/ML injection 30 mg (30 mg Intramuscular Given 04/12/23 1736)    Initial Impression / Assessment and Plan / UC Course  I have reviewed the triage vital signs and the nursing notes.  Pertinent labs & imaging results that were available during my care of the patient were reviewed by me  and considered in my medical decision making (see chart for details).  Pain of left calf, neck pain on right side  Etiology most likely muscular low suspicion for bone involvement therefore deferring imaging, discussed with patient, Toradol IM given and prescribed prednisone and baclofen for home use recommended use of RICE, heat massage stretching with activity as tolerated Final Clinical Impressions(s) / UC Diagnoses   Final diagnoses:  Pain of left calf  Neck pain on right side     Discharge Instructions      Your pain is most likely caused by irritation to the muscles.  You have been given an injection of Toradol today here in the clinic to help reduce pain and inflammation ideally will see improvement within the hour  Starting tomorrow take prednisone every morning as directed to continue the above process, may use Tylenol or any topical medicines in addition to this  You may use baclofen at bedtime to allow for also to relax and for rest  You may use heating pad in 15 minute intervals as needed for additional comfort, or you may find comfort in using ice in 10-15 minutes over affected area  Begin massaging and stretching affected area daily for 10 minutes as tolerated to further loosen muscles   When sitting and  lying down place pillow underneath arm and legs and between knees for support  If pain persist after recommended treatment or reoccurs if may be beneficial to follow up with orthopedic specialist for evaluation, this doctor specializes in the bones and can manage your symptoms long-term with options such as but not limited to imaging, medications or physical therapy  ED Prescriptions     Medication Sig Dispense Auth. Provider   predniSONE (STERAPRED UNI-PAK 21 TAB) 10 MG (21) TBPK tablet Take by mouth daily. Take 6 tabs by mouth daily  for 1 days, then 5 tabs for 1 days, then 4 tabs for 1 days, then 3 tabs for 1 days, 2 tabs for 1 days, then 1 tab by mouth  daily for 1 days 21 tablet Hobert Poplaski R, NP   baclofen (LIORESAL) 10 MG tablet Take 1 tablet (10 mg total) by mouth at bedtime as needed for muscle spasms. 10 each Valinda Hoar, NP      PDMP not reviewed this encounter.   Valinda Hoar, NP 04/12/23 [phone]

## 2023-04-12 NOTE — Discharge Instructions (Addendum)
Your pain is most likely caused by irritation to the muscles.  You have been given an injection of Toradol today here in the clinic to help reduce pain and inflammation ideally will see improvement within the hour  Starting tomorrow take prednisone every morning as directed to continue the above process, may use Tylenol or any topical medicines in addition to this  You may use baclofen at bedtime to allow for also to relax and for rest  You may use heating pad in 15 minute intervals as needed for additional comfort, or you may find comfort in using ice in 10-15 minutes over affected area  Begin massaging and stretching affected area daily for 10 minutes as tolerated to further loosen muscles   When sitting and  lying down place pillow underneath arm and legs and between knees for support  If pain persist after recommended treatment or reoccurs if may be beneficial to follow up with orthopedic specialist for evaluation, this doctor specializes in the bones and can manage your symptoms long-term with options such as but not limited to imaging, medications or physical therapy

## 2023-04-12 NOTE — ED Triage Notes (Signed)
Patient states left knee or calf pain since last night.  Also neck/shoulder pain for about 2 months after heavy lifting at the [facility].  Taking OTC Aleve with little relief

## 2024-01-27 LAB — HM MAMMOGRAPHY

## 2024-04-18 ENCOUNTER — Telehealth: Payer: Self-pay | Admitting: Family Medicine

## 2024-04-18 NOTE — Telephone Encounter (Signed)
 Patient came in looking to become a new patient with Ellen Acosta. She states brother and father has had him as  a primary care and would like to establish.

## 2024-04-19 NOTE — Telephone Encounter (Signed)
 Please set up new patient visit in 2026.  Thanks.

## 2024-04-24 NOTE — Telephone Encounter (Signed)
 I called and left a voicemail for patient to be scheduled.

## 2024-05-25 ENCOUNTER — Ambulatory Visit: Admitting: Family Medicine

## 2024-05-25 ENCOUNTER — Encounter: Payer: Self-pay | Admitting: Family Medicine

## 2024-05-25 VITALS — BP 122/80 | HR 61 | Temp 97.9°F | Ht 59.0 in | Wt 155.4 lb

## 2024-05-25 DIAGNOSIS — F329 Major depressive disorder, single episode, unspecified: Secondary | ICD-10-CM | POA: Diagnosis not present

## 2024-05-25 DIAGNOSIS — Z7984 Long term (current) use of oral hypoglycemic drugs: Secondary | ICD-10-CM

## 2024-05-25 DIAGNOSIS — E119 Type 2 diabetes mellitus without complications: Secondary | ICD-10-CM

## 2024-05-25 DIAGNOSIS — Z8673 Personal history of transient ischemic attack (TIA), and cerebral infarction without residual deficits: Secondary | ICD-10-CM

## 2024-05-25 DIAGNOSIS — M542 Cervicalgia: Secondary | ICD-10-CM | POA: Diagnosis not present

## 2024-05-25 DIAGNOSIS — Z7189 Other specified counseling: Secondary | ICD-10-CM

## 2024-05-25 NOTE — Patient Instructions (Signed)
 Let me see about options in the meantime.  Don't change your meds for now.  Plan on recheck labs at a visit in about 3 months.  Take care.  Glad to see you.

## 2024-05-25 NOTE — Progress Notes (Signed)
 She is the daughter of Kirby Jaeger, a former patient here.  Discussed that I was always glad to see him.    Brother Abby Jaeger designated if patient were incapacitated.  Status post right C3-C5 RFA, seen by outside clinic.  Still having pain.  She had MRI per outside clinic.   1. Lumbar spondylosis and post-operative changes as outlined within the body of the report.  2. Prior C5-C7 ACDF.  3. No more than mild relative spinal canal narrowing.  4. Advanced facet arthropathy contributes to severe neural foraminal narrowing on the left at C3-C4 and C4-C5. No more than mild foraminal stenosis elsewhere.  5. Facet ankylosis questioned on the right at C2-C3.  6. Chronic pontine infarct.   She is still having HA behind her eyes, attributed to neck pain as the primary pain generator.   Taking aleve, icing.  She had eye exam done.  [facility].   Psych per Dr. Vincente.  Adderall clearly helped her mood.    Dm2, controlled with metformin, no ADE on med. Last A1c at goal.  Discussed follow-up diabetes in about 3 months given her previous labs.  She is working to lose weight with diet and exercise.  Chronic pontine infarct incidentally noted on imaging above.  She did not have any symptomatic history of CVA.  She is not aware of any previous diagnosis of CVA.  Unclear if this is completely symptomatic or related to anything else she has had.  No new focal neurologic symptoms.  Meds, vitals, and allergies reviewed.   ROS: Per HPI unless specifically indicated in ROS section   GEN: nad, alert and oriented HEENT: mucous membranes moist NECK: supple w/o LA CV: rrr PULM: ctab, no inc wob ABD: soft, +bs EXT: no edema SKIN: no acute rash

## 2024-05-28 ENCOUNTER — Telehealth: Payer: Self-pay | Admitting: Family Medicine

## 2024-05-28 ENCOUNTER — Encounter: Payer: Self-pay | Admitting: Family Medicine

## 2024-05-28 DIAGNOSIS — Z8673 Personal history of transient ischemic attack (TIA), and cerebral infarction without residual deficits: Secondary | ICD-10-CM

## 2024-05-28 DIAGNOSIS — E119 Type 2 diabetes mellitus without complications: Secondary | ICD-10-CM

## 2024-05-28 DIAGNOSIS — Z7189 Other specified counseling: Secondary | ICD-10-CM | POA: Insufficient documentation

## 2024-05-28 MED ORDER — ASPIRIN 81 MG PO TBEC: 81.0000 mg | DELAYED_RELEASE_TABLET | Freq: Every day | ORAL | Status: AC

## 2024-05-28 NOTE — Assessment & Plan Note (Signed)
" °  Psych per Dr. Vincente.  Adderall clearly helped her mood.   Continue as is. "

## 2024-05-28 NOTE — Assessment & Plan Note (Signed)
 Incidentally noted.  Last A1c controlled.  Already taking metoprolol  and Crestor.  See following phone note.  Unclear if this is related to her headaches.

## 2024-05-28 NOTE — Telephone Encounter (Signed)
 Please call patient.  I have been considering her situation.  I would start taking aspirin  81 mg a day assuming she did not have any bleeding.  This is due to the previous pontine infarct that was noted on her outside records.  I would like to get an MRI of her brain to make sure she has not had another event of which we are unaware currently.  I put in the order for the MRI and she should get a call about that.  Please let me know if she cannot get scheduled.  I put in orders for future labs.  I would like to get those collected before visit in about 3 months.  We will update her in the meantime when her MRI is resulted.    Please request a copy of her eye exam from [facility] eye care.   Please update her mammogram from 01/27/2024 that is available in care everywhere in health maintenance.   Thanks.

## 2024-05-28 NOTE — Assessment & Plan Note (Signed)
 Continue metformin.  Recheck periodically.

## 2024-05-28 NOTE — Assessment & Plan Note (Signed)
 Per outside clinic.

## 2024-05-28 NOTE — Assessment & Plan Note (Signed)
" °  Brother Ellen Acosta designated if patient were incapacitated. "

## 2024-05-29 NOTE — Telephone Encounter (Signed)
 Called patient reviewed all information and repeated back to me. Will call if any questions.  Patient denies any bleeding she will start ASA as recommended. I have set up lab appointment a few days before the follow up she has scheduled for April. She will call if no call for appointment for MRI

## 2024-05-29 NOTE — Addendum Note (Signed)
 Addended by: SEBASTIAN DANNA GRADE on: 05/29/2024 08:21 AM   Modules accepted: Orders

## 2024-05-29 NOTE — Telephone Encounter (Signed)
 Noted. Thanks.    Please request a copy of her eye exam from [facility] eye care.    Please update her mammogram from 01/27/2024 that is available in care everywhere in health maintenance.

## 2024-06-01 NOTE — Telephone Encounter (Signed)
 Information updated in chart as well as request sent to [facility].  No further action needed at this time.

## 2024-06-02 ENCOUNTER — Ambulatory Visit (HOSPITAL_COMMUNITY)
Admission: RE | Admit: 2024-06-02 | Discharge: 2024-06-02 | Disposition: A | Source: Ambulatory Visit | Attending: Family Medicine | Admitting: Family Medicine

## 2024-06-02 DIAGNOSIS — Z8673 Personal history of transient ischemic attack (TIA), and cerebral infarction without residual deficits: Secondary | ICD-10-CM | POA: Insufficient documentation

## 2024-06-02 MED ORDER — GADOBUTROL 1 MMOL/ML IV SOLN
7.0000 mL | Freq: Once | INTRAVENOUS | Status: AC | PRN
  Administered 2024-06-02: 7 mL via INTRAVENOUS

## 2024-06-08 ENCOUNTER — Ambulatory Visit: Payer: Self-pay | Admitting: Family Medicine

## 2024-06-08 DIAGNOSIS — Z8673 Personal history of transient ischemic attack (TIA), and cerebral infarction without residual deficits: Secondary | ICD-10-CM

## 2024-06-08 NOTE — Telephone Encounter (Signed)
 Copied from CRM [id number]. Topic: Clinical - Lab/Test Results >> Jun 08, 2024  9:09 AM Laymon HERO wrote: Reason for CRM: Patient asking for a nurse to call her to go over MRI results

## 2024-06-09 NOTE — Telephone Encounter (Unsigned)
 Copied from CRM [id number]. Topic: Clinical - Lab/Test Results >> Jun 08, 2024  9:09 AM Laymon HERO wrote: Reason for CRM: Patient asking for a nurse to call her to go over MRI results >> Jun 09, 2024  4:29 PM Viola F wrote: Patient called to follow up on MRI results - she would like a call back as soon as possible with information on results and the reason she needs an Ultrasound.

## 2024-06-12 ENCOUNTER — Telehealth: Payer: Self-pay | Admitting: Family Medicine

## 2024-06-12 NOTE — Telephone Encounter (Signed)
 Copied from CRM [id number]. Topic: Clinical - Request for Lab/Test Order >> Jun 09, 2024  4:23 PM Viola F wrote: Reason for CRM: Patient received call from [facility] regarding the [place]  Carotid Bilateral - her insurance is out of network and she needs another location where insurance is accepted. Please call her with an update at  [phone]

## 2024-06-12 NOTE — Addendum Note (Signed)
 Addended by: CLEATUS LORELI RAMAN on: 06/12/2024 07:55 PM   Modules accepted: Orders

## 2024-06-14 NOTE — Telephone Encounter (Signed)
 What location will be accepted by her insurance?  Please let me know and I will put that in.  Thanks.

## 2024-06-16 ENCOUNTER — Telehealth: Payer: Self-pay | Admitting: Family Medicine

## 2024-06-16 NOTE — Telephone Encounter (Signed)
 Left message to return call to our office.  Please provide  message from provider/office when call is returned from patient.

## 2024-06-16 NOTE — Telephone Encounter (Signed)
 Copied from CRM [id number]. Topic: General - Other >> Jun 16, 2024  1:46 PM Pinkey ORN wrote: Reason for CRM: Returning Missed Office Call >> Jun 16, 2024  1:49 PM Pinkey ORN wrote: Patient is returning a missed office call from Sebastian Danna GRADE, CMA. Patient states she's not sure about the procedure that Cleatus Arlyss RAMAN, MD is wanting her to have done, therefor she's not sure which location.

## 2024-08-21 ENCOUNTER — Other Ambulatory Visit

## 2024-08-24 ENCOUNTER — Ambulatory Visit: Admitting: Family Medicine
